# Patient Record
Sex: Female | Born: 1975 | Race: White | Hispanic: No | Marital: Married | Smoking: Never smoker
Health system: Southern US, Community
[De-identification: ages and names within clinical notes are randomized; demographics above are authoritative.]

## PROBLEM LIST (undated history)

## (undated) DIAGNOSIS — I10 Essential (primary) hypertension: Secondary | ICD-10-CM

## (undated) DIAGNOSIS — D497 Neoplasm of unspecified behavior of endocrine glands and other parts of nervous system: Secondary | ICD-10-CM

## (undated) DIAGNOSIS — Z9889 Other specified postprocedural states: Secondary | ICD-10-CM

## (undated) HISTORY — PX: OTHER SURGICAL HISTORY: SHX169

## (undated) HISTORY — DX: Essential (primary) hypertension: I10

## (undated) HISTORY — DX: Neoplasm of unspecified behavior of endocrine glands and other parts of nervous system: D49.7

## (undated) HISTORY — PX: TENDON REPAIR: SHX5111

---

## 1998-05-13 ENCOUNTER — Ambulatory Visit (HOSPITAL_BASED_OUTPATIENT_CLINIC_OR_DEPARTMENT_OTHER): Admission: RE | Admit: 1998-05-13 | Discharge: 1998-05-13 | Payer: Self-pay | Admitting: *Deleted

## 1998-09-25 ENCOUNTER — Other Ambulatory Visit: Admission: RE | Admit: 1998-09-25 | Discharge: 1998-09-25 | Payer: Self-pay | Admitting: Obstetrics and Gynecology

## 2000-01-18 ENCOUNTER — Other Ambulatory Visit: Admission: RE | Admit: 2000-01-18 | Discharge: 2000-01-18 | Payer: Self-pay | Admitting: Obstetrics and Gynecology

## 2001-01-17 ENCOUNTER — Other Ambulatory Visit: Admission: RE | Admit: 2001-01-17 | Discharge: 2001-01-17 | Payer: Self-pay | Admitting: Obstetrics and Gynecology

## 2002-03-28 ENCOUNTER — Other Ambulatory Visit: Admission: RE | Admit: 2002-03-28 | Discharge: 2002-03-28 | Payer: Self-pay | Admitting: Obstetrics and Gynecology

## 2003-04-02 ENCOUNTER — Other Ambulatory Visit: Admission: RE | Admit: 2003-04-02 | Discharge: 2003-04-02 | Payer: Self-pay | Admitting: Obstetrics and Gynecology

## 2004-04-09 ENCOUNTER — Other Ambulatory Visit: Admission: RE | Admit: 2004-04-09 | Discharge: 2004-04-09 | Payer: Self-pay | Admitting: Obstetrics and Gynecology

## 2005-06-16 ENCOUNTER — Other Ambulatory Visit: Admission: RE | Admit: 2005-06-16 | Discharge: 2005-06-16 | Payer: Self-pay | Admitting: Obstetrics and Gynecology

## 2006-08-08 ENCOUNTER — Other Ambulatory Visit: Admission: RE | Admit: 2006-08-08 | Discharge: 2006-08-08 | Payer: Self-pay | Admitting: Obstetrics & Gynecology

## 2007-10-12 ENCOUNTER — Other Ambulatory Visit: Admission: RE | Admit: 2007-10-12 | Discharge: 2007-10-12 | Payer: Self-pay | Admitting: Obstetrics and Gynecology

## 2008-11-28 ENCOUNTER — Other Ambulatory Visit: Admission: RE | Admit: 2008-11-28 | Discharge: 2008-11-28 | Payer: Self-pay | Admitting: Obstetrics and Gynecology

## 2018-08-16 ENCOUNTER — Encounter: Payer: Self-pay | Admitting: Adult Health

## 2018-08-22 ENCOUNTER — Encounter: Payer: Self-pay | Admitting: Adult Health

## 2018-09-07 ENCOUNTER — Encounter (INDEPENDENT_AMBULATORY_CARE_PROVIDER_SITE_OTHER): Payer: Self-pay

## 2018-09-07 ENCOUNTER — Ambulatory Visit (INDEPENDENT_AMBULATORY_CARE_PROVIDER_SITE_OTHER): Payer: PRIVATE HEALTH INSURANCE | Admitting: Adult Health

## 2018-09-07 ENCOUNTER — Encounter: Payer: Self-pay | Admitting: Adult Health

## 2018-09-07 VITALS — BP 137/81 | HR 82 | Ht 64.0 in | Wt 219.0 lb

## 2018-09-07 DIAGNOSIS — Z1212 Encounter for screening for malignant neoplasm of rectum: Secondary | ICD-10-CM

## 2018-09-07 DIAGNOSIS — Z1211 Encounter for screening for malignant neoplasm of colon: Secondary | ICD-10-CM | POA: Diagnosis not present

## 2018-09-07 DIAGNOSIS — Z975 Presence of (intrauterine) contraceptive device: Secondary | ICD-10-CM

## 2018-09-07 DIAGNOSIS — Z01419 Encounter for gynecological examination (general) (routine) without abnormal findings: Secondary | ICD-10-CM

## 2018-09-07 LAB — HEMOCCULT GUIAC POC 1CARD (OFFICE): FECAL OCCULT BLD: NEGATIVE

## 2018-09-07 NOTE — Progress Notes (Signed)
Patient ID: Regina Fisher, female   DOB: [birth year], 43 y.o.   MRN: [id number] History of Present Illness:  Regina Fisher is a 43 year old white female, married, G0P0 in for well woman gyn exam, she had normal pap last year she says.She is a new pt.She has nexplanon and it is due to be removed in November. She has a optic nerve sheath meningioma and needs to switch to a non hormonal birth control, she thinks,She has MRI and F/U  With neurologist in March, Dr Arlan Organ at [facility] and will ask him again about this. Her ophthalmologist  is Dr Hassell Done at [facility].  PCP is [place].  Current Medications, Allergies, Past Medical History, Past Surgical History, Family History and Social History were reviewed in [employer] record.     Review of Systems:  Patient denies any headaches, hearing loss, fatigue, blurred vision, shortness of breath, chest pain, abdominal pain, problems with bowel movements, urination, or intercourse. No joint pain or mood swings. She says she has not had period in like 20 years due to depo or nexplanon.   Physical Exam:BP 137/81 (BP Location: Left Arm, Patient Position: Sitting, Cuff Size: Large)   Pulse 82   Ht 5\' 4"  (1.626 m)   Wt 219 lb (99.3 kg)   BMI 37.59 kg/m  General:  Well developed, well nourished, no acute distress Skin:  Warm and dry Neck:  Midline trachea, normal thyroid, good ROM, no lymphadenopathy Lungs; Clear to auscultation bilaterally Breast:  No dominant palpable mass, retraction, or nipple discharge Cardiovascular: Regular rate and rhythm Abdomen:  Soft, non tender, no hepatosplenomegaly Pelvic:  External genitalia is normal in appearance, no lesions.  The vagina is normal in appearance. Urethra has no lesions or masses. The cervix is nulliparous.  Uterus is felt to be normal size, shape, and contour.  No adnexal masses or tenderness noted.Bladder is non tender, no masses felt. Rectal: Good sphincter tone, no polyps, or hemorrhoids felt.   Hemoccult negative. Extremities/musculoskeletal:  No swelling or varicosities noted, no clubbing or cyanosis. Nexplanon palpated in left arm.  Psych:  No mood changes, alert and cooperative,seems happy Fall risk is low. PHQ 2 score 0. Examination chaperoned by Diona Fanti CMA. Discussed could get paragard IUD, or have tubal and ablation or get husband to get vasectomy.   Impression:  1. Encounter for well woman exam with routine gynecological exam   2. Screening for colorectal cancer   3. Nexplanon in place      Plan:  Review handouts on paragard, tubal and ablation Physical in 1 year Pap in 2021 Mammogram yearly, has scheduled today at 2 pm Labs with PCP Colonoscopy between 45-50  Call if wants nexplanon removed

## 2018-09-21 ENCOUNTER — Other Ambulatory Visit: Payer: Self-pay

## 2018-09-21 ENCOUNTER — Emergency Department (HOSPITAL_COMMUNITY): Payer: PRIVATE HEALTH INSURANCE

## 2018-09-21 ENCOUNTER — Inpatient Hospital Stay (HOSPITAL_COMMUNITY)
Admission: EM | Admit: 2018-09-21 | Discharge: 2018-09-23 | DRG: 446 | Disposition: A | Discharge status: Home / Self Care | Payer: PRIVATE HEALTH INSURANCE | Attending: Family Medicine | Admitting: Family Medicine

## 2018-09-21 ENCOUNTER — Encounter (HOSPITAL_COMMUNITY): Payer: Self-pay | Admitting: Emergency Medicine

## 2018-09-21 DIAGNOSIS — K8020 Calculus of gallbladder without cholecystitis without obstruction: Secondary | ICD-10-CM | POA: Diagnosis present

## 2018-09-21 DIAGNOSIS — K8000 Calculus of gallbladder with acute cholecystitis without obstruction: Secondary | ICD-10-CM | POA: Diagnosis present

## 2018-09-21 DIAGNOSIS — K810 Acute cholecystitis: Secondary | ICD-10-CM | POA: Diagnosis present

## 2018-09-21 DIAGNOSIS — Z8249 Family history of ischemic heart disease and other diseases of the circulatory system: Secondary | ICD-10-CM | POA: Diagnosis not present

## 2018-09-21 DIAGNOSIS — Z8262 Family history of osteoporosis: Secondary | ICD-10-CM

## 2018-09-21 DIAGNOSIS — Z818 Family history of other mental and behavioral disorders: Secondary | ICD-10-CM | POA: Diagnosis not present

## 2018-09-21 DIAGNOSIS — Z79899 Other long term (current) drug therapy: Secondary | ICD-10-CM

## 2018-09-21 DIAGNOSIS — Z793 Long term (current) use of hormonal contraceptives: Secondary | ICD-10-CM | POA: Diagnosis not present

## 2018-09-21 DIAGNOSIS — K819 Cholecystitis, unspecified: Secondary | ICD-10-CM | POA: Diagnosis present

## 2018-09-21 DIAGNOSIS — R17 Unspecified jaundice: Secondary | ICD-10-CM

## 2018-09-21 LAB — COMPREHENSIVE METABOLIC PANEL
ALT: 474 U/L — ABNORMAL HIGH (ref 0–44)
AST: 574 U/L — ABNORMAL HIGH (ref 15–41)
Albumin: 4.1 g/dL (ref 3.5–5.0)
Alkaline Phosphatase: 141 U/L — ABNORMAL HIGH (ref 38–126)
Anion gap: 10 (ref 5–15)
BUN: 8 mg/dL (ref 6–20)
CO2: 19 mmol/L — ABNORMAL LOW (ref 22–32)
Calcium: 9.2 mg/dL (ref 8.9–10.3)
Chloride: 108 mmol/L (ref 98–111)
Creatinine, Ser: 0.75 mg/dL (ref 0.44–1.00)
GFR calc Af Amer: >60 mL/min (ref >60)
Glucose, Bld: 126 mg/dL — ABNORMAL HIGH (ref 70–99)
Potassium: 3.7 mmol/L (ref 3.5–5.1)
Sodium: 137 mmol/L (ref 135–145)
Total Bilirubin: 1.8 mg/dL — ABNORMAL HIGH (ref 0.3–1.2)
Total Protein: 8 g/dL (ref 6.5–8.1)

## 2018-09-21 LAB — URINALYSIS, ROUTINE W REFLEX MICROSCOPIC
Bilirubin Urine: NEGATIVE
Glucose, UA: NEGATIVE mg/dL
Hgb urine dipstick: NEGATIVE
Ketones, ur: NEGATIVE mg/dL
Leukocytes,Ua: NEGATIVE
Nitrite: NEGATIVE
Protein, ur: NEGATIVE mg/dL
SPECIFIC GRAVITY, URINE: 1.038 — AB (ref 1.005–1.030)
pH: 5 (ref 5.0–8.0)

## 2018-09-21 LAB — CBC
HCT: 41.2 % (ref 36.0–46.0)
Hemoglobin: 14 g/dL (ref 12.0–15.0)
MCH: 28.5 pg (ref 26.0–34.0)
MCHC: 34 g/dL (ref 30.0–36.0)
MCV: 83.9 fL (ref 80.0–100.0)
NRBC: 0 % (ref 0.0–0.2)
Platelets: 386 10*3/uL (ref 150–400)
RBC: 4.91 MIL/uL (ref 3.87–5.11)
RDW: 13.2 % (ref 11.5–15.5)
WBC: 7.4 10*3/uL (ref 4.0–10.5)

## 2018-09-21 LAB — I-STAT BETA HCG BLOOD, ED (MC, WL, AP ONLY): I-stat hCG, quantitative: <5 m[IU]/mL (ref <5)

## 2018-09-21 LAB — LIPASE, BLOOD: LIPASE: 33 U/L (ref 11–51)

## 2018-09-21 MED ORDER — ONDANSETRON HCL 4 MG/2ML IJ SOLN
4.0000 mg | Freq: Four times a day (QID) | INTRAMUSCULAR | Status: DC | PRN
  Administered 2018-09-21 – 2018-09-22 (×3): 4 mg via INTRAVENOUS
  Filled 2018-09-21 (×3): qty 2

## 2018-09-21 MED ORDER — ALBUTEROL SULFATE (2.5 MG/3ML) 0.083% IN NEBU: 2.5000 mg | INHALATION_SOLUTION | RESPIRATORY_TRACT | Status: DC | PRN

## 2018-09-21 MED ORDER — SODIUM CHLORIDE 0.9% FLUSH
3.0000 mL | Freq: Two times a day (BID) | INTRAVENOUS | Status: DC
  Administered 2018-09-22 (×2): 3 mL via INTRAVENOUS

## 2018-09-21 MED ORDER — HEPARIN SODIUM (PORCINE) 5000 UNIT/ML IJ SOLN
5000.0000 [IU] | Freq: Three times a day (TID) | INTRAMUSCULAR | Status: DC
  Administered 2018-09-21: 5000 [IU] via SUBCUTANEOUS
  Filled 2018-09-21: qty 1

## 2018-09-21 MED ORDER — IOHEXOL 300 MG/ML  SOLN
100.0000 mL | Freq: Once | INTRAMUSCULAR | Status: AC | PRN
  Administered 2018-09-21: 100 mL via INTRAVENOUS

## 2018-09-21 MED ORDER — IOPAMIDOL (ISOVUE-300) INJECTION 61%: 100.0000 mL | Freq: Once | INTRAVENOUS | Status: DC | PRN

## 2018-09-21 MED ORDER — SODIUM CHLORIDE 0.9 % IV SOLN
INTRAVENOUS | Status: DC
  Administered 2018-09-21: 11:00:00 via INTRAVENOUS

## 2018-09-21 MED ORDER — TRAZODONE HCL 50 MG PO TABS: 50.0000 mg | ORAL_TABLET | Freq: Every evening | ORAL | Status: DC | PRN

## 2018-09-21 MED ORDER — HYDROMORPHONE HCL 1 MG/ML IJ SOLN
1.0000 mg | INTRAMUSCULAR | Status: DC | PRN
  Administered 2018-09-21 – 2018-09-22 (×5): 1 mg via INTRAVENOUS
  Filled 2018-09-21 (×5): qty 1

## 2018-09-21 MED ORDER — SODIUM CHLORIDE 0.9 % IV SOLN: 1.0000 g | INTRAVENOUS | Status: DC

## 2018-09-21 MED ORDER — KETOROLAC TROMETHAMINE 15 MG/ML IJ SOLN: 15.0000 mg | Freq: Four times a day (QID) | INTRAMUSCULAR | Status: DC | PRN

## 2018-09-21 MED ORDER — KCL IN DEXTROSE-NACL 20-5-0.45 MEQ/L-%-% IV SOLN
INTRAVENOUS | Status: DC
  Administered 2018-09-21 – 2018-09-23 (×4): via INTRAVENOUS
  Filled 2018-09-21 (×4): qty 1000

## 2018-09-21 MED ORDER — SODIUM CHLORIDE 0.9 % IV SOLN: 250.0000 mL | INTRAVENOUS | Status: DC | PRN

## 2018-09-21 MED ORDER — ACETAMINOPHEN 650 MG RE SUPP: 650.0000 mg | Freq: Four times a day (QID) | RECTAL | Status: DC | PRN

## 2018-09-21 MED ORDER — FAMOTIDINE IN NACL 20-0.9 MG/50ML-% IV SOLN
20.0000 mg | Freq: Two times a day (BID) | INTRAVENOUS | Status: DC
  Administered 2018-09-21 – 2018-09-23 (×4): 20 mg via INTRAVENOUS
  Filled 2018-09-21 (×4): qty 50

## 2018-09-21 MED ORDER — SODIUM CHLORIDE 0.9 % IV SOLN: 1.0000 g | Freq: Once | INTRAVENOUS | Status: DC

## 2018-09-21 MED ORDER — POLYETHYLENE GLYCOL 3350 17 G PO PACK: 17.0000 g | PACK | Freq: Every day | ORAL | Status: DC | PRN

## 2018-09-21 MED ORDER — SODIUM CHLORIDE 0.9% FLUSH: 3.0000 mL | INTRAVENOUS | Status: DC | PRN

## 2018-09-21 MED ORDER — SODIUM CHLORIDE 0.9 % IV SOLN
2.0000 g | INTRAVENOUS | Status: DC
  Administered 2018-09-21 – 2018-09-22 (×2): 2 g via INTRAVENOUS
  Filled 2018-09-21 (×2): qty 20

## 2018-09-21 MED ORDER — HYDROMORPHONE HCL 1 MG/ML IJ SOLN
1.0000 mg | Freq: Once | INTRAMUSCULAR | Status: AC
  Administered 2018-09-21: 1 mg via INTRAVENOUS
  Filled 2018-09-21: qty 1

## 2018-09-21 MED ORDER — ONDANSETRON HCL 4 MG/2ML IJ SOLN
4.0000 mg | Freq: Once | INTRAMUSCULAR | Status: AC
  Administered 2018-09-21: 4 mg via INTRAVENOUS
  Filled 2018-09-21: qty 2

## 2018-09-21 MED ORDER — ONDANSETRON HCL 4 MG PO TABS: 4.0000 mg | ORAL_TABLET | Freq: Four times a day (QID) | ORAL | Status: DC | PRN

## 2018-09-21 MED ORDER — ACETAMINOPHEN 325 MG PO TABS
650.0000 mg | ORAL_TABLET | Freq: Four times a day (QID) | ORAL | Status: DC | PRN
  Administered 2018-09-21 – 2018-09-22 (×3): 650 mg via ORAL
  Filled 2018-09-21 (×3): qty 2

## 2018-09-21 MED ORDER — SODIUM CHLORIDE 0.9 % IV BOLUS
1000.0000 mL | Freq: Once | INTRAVENOUS | Status: AC
  Administered 2018-09-21: 1000 mL via INTRAVENOUS

## 2018-09-21 NOTE — ED Provider Notes (Signed)
[facility] EMERGENCY DEPARTMENT Provider Note   CSN: [id number] Arrival date & time: 09/21/18  0749    History   Chief Complaint Chief Complaint  Patient presents with  . Abdominal Pain    HPI Regina Fisher is a 43 y.o. female.     Patient with acute onset of epigastric abdominal pain at around 1 PM yesterday.  It is been constant patient has vomited multiple times.  Associated with a few loose bowel movements.  No true diarrhea or fevers.  Patient had a similar episode about a week ago that lasted just for a few hours and then resolved.  This has been persistent.  Pain is 10 out of 10.     Past Medical History:  Diagnosis Date  . Tumor of optic nerve     Patient Active Problem List   Diagnosis Date Noted  . Acute cholecystitis 09/21/2018  . Screening for colorectal cancer 09/07/2018  . Encounter for well woman exam with routine gynecological exam 09/07/2018  . Nexplanon in place 09/07/2018    Past Surgical History:  Procedure Laterality Date  . bone spur    . fatty turmor removal back    . gamma knife 2018    . ganna knight    . TENDON REPAIR       OB History    Gravida  0   Para  0   Term  0   Preterm  0   AB  0   Living  0     SAB  0   TAB  0   Ectopic  0   Multiple  0   Live Births  0            Home Medications    Prior to Admission medications   Medication Sig Start Date End Date Taking? Authorizing Provider  BLACK ELDERBERRY,BERRY-FLOWER, PO Take by mouth daily.    Yes [provider]  etonogestrel (NEXPLANON) 68 MG IMPL implant 1 each by Subdermal route once.   Yes [provider]    Family History Family History  Problem Relation Age of Onset  . Osteoporosis Maternal Grandmother   . Hypertension Mother   . Mental illness Brother     Social History Social History   Tobacco Use  . Smoking status: Never Smoker  . Smokeless tobacco: Never Used  Substance Use Topics  . Alcohol use: Never   Frequency: Never  . Drug use: Never     Allergies   Patient has no known allergies.   Review of Systems Review of Systems  Constitutional: Negative for chills and fever.  HENT: Negative for congestion, rhinorrhea and sore throat.   Eyes: Negative for visual disturbance.  Respiratory: Negative for cough and shortness of breath.   Cardiovascular: Negative for chest pain and leg swelling.  Gastrointestinal: Positive for abdominal pain, diarrhea, nausea and vomiting.  Genitourinary: Negative for dysuria.  Musculoskeletal: Negative for back pain and neck pain.  Skin: Negative for rash.  Neurological: Negative for dizziness, syncope, light-headedness and headaches.  Hematological: Does not bruise/bleed easily.  Psychiatric/Behavioral: Negative for confusion.     Physical Exam Updated Vital Signs BP (!) 151/90   Pulse 73   Temp 98.1 F (36.7 C) (Oral)   Resp 16   Ht 1.626 m (5\' 4" )   Wt 97.5 kg   SpO2 99%   BMI 36.90 kg/m   Physical Exam Vitals signs and nursing note reviewed.  Constitutional:      General:  She is not in acute distress.    Appearance: She is well-developed.  HENT:     Head: Normocephalic and atraumatic.     Mouth/Throat:     Mouth: Mucous membranes are moist.  Eyes:     Extraocular Movements: Extraocular movements intact.     Conjunctiva/sclera: Conjunctivae normal.     Pupils: Pupils are equal, round, and reactive to light.  Neck:     Musculoskeletal: Normal range of motion and neck supple.  Cardiovascular:     Rate and Rhythm: Normal rate and regular rhythm.     Heart sounds: No murmur.  Pulmonary:     Effort: Pulmonary effort is normal. No respiratory distress.     Breath sounds: Normal breath sounds.  Abdominal:     General: Bowel sounds are normal.     Palpations: Abdomen is soft.     Tenderness: There is abdominal tenderness.     Comments: Marked tenderness right upper quadrant with guarding  Musculoskeletal: Normal range of motion.    Skin:    General: Skin is warm and dry.     Capillary Refill: Capillary refill takes less than 2 seconds.  Neurological:     General: No focal deficit present.     Mental Status: She is alert and oriented to person, place, and time.      ED Treatments / Results  Labs (all labs ordered are listed, but only abnormal results are displayed) Labs Reviewed  COMPREHENSIVE METABOLIC PANEL - Abnormal; Notable for the following components:      Result Value   CO2 19 (*)    Glucose, Bld 126 (*)    AST 574 (*)    ALT 474 (*)    Alkaline Phosphatase 141 (*)    Total Bilirubin 1.8 (*)    All other components within normal limits  URINALYSIS, ROUTINE W REFLEX MICROSCOPIC - Abnormal; Notable for the following components:   Color, Urine AMBER (*)    Specific Gravity, Urine 1.038 (*)    All other components within normal limits  LIPASE, BLOOD  CBC  HIV ANTIBODY (ROUTINE TESTING W REFLEX)  I-STAT BETA HCG BLOOD, ED ([facility], [facility], [facility] ONLY)    EKG None  Radiology Ct Abdomen Pelvis W Contrast  Result Date: 09/21/2018 CLINICAL DATA:  Patient with upper abdominal pain radiating to the umbilicus. EXAM: CT ABDOMEN AND PELVIS WITH CONTRAST TECHNIQUE: Multidetector CT imaging of the abdomen and pelvis was performed using the standard protocol following bolus administration of intravenous contrast. CONTRAST:  168mL OMNIPAQUE IOHEXOL 300 MG/ML  SOLN COMPARISON:  None. FINDINGS: Lower chest: Normal heart size. Dependent atelectasis bilateral lower lobes. No pleural effusion. Hepatobiliary: Liver is normal in size and contour. Multiple gallstones. Wall thickening of the gallbladder. No intrahepatic or extrahepatic biliary ductal dilatation. Mild heterogeneity along the portal veins within the liver (image 18; series 2). Pancreas: Unremarkable Spleen: Unremarkable Adrenals/Urinary Tract: Normal adrenal glands. Kidneys enhance symmetrically with contrast. No hydronephrosis. Urinary bladder is unremarkable.  Stomach/Bowel: Sigmoid colonic diverticulosis. No CT evidence for acute diverticulitis. No abnormal bowel wall thickening or evidence for bowel obstruction. No free fluid or free intraperitoneal air. Small hiatal hernia. Normal morphology of the stomach. Vascular/Lymphatic: Normal caliber abdominal aorta. Peripheral calcified atherosclerotic plaque. No retroperitoneal lymphadenopathy. Reproductive: Uterus is unremarkable. Adnexal structures are unremarkable. Other: None. Musculoskeletal: Lumbar spine degenerative changes. No aggressive or acute appearing osseous lesions. IMPRESSION: 1. Mild wall thickening of the gallbladder. Multiple gallstones. Findings are indeterminate however raise the possibility of cholecystitis. Recommend  correlation with right upper quadrant ultrasound. 2. There is mild heterogeneous low attenuation along the course of the portal veins within the liver which may be secondary to intravenous hydration. Possibility of changes related to cholangitis not excluded. Recommend clinical and laboratory correlation. Electronically Signed   By: Lovey Newcomer M.D.   On: 09/21/2018 11:15   US Abdomen Limited  Result Date: 09/21/2018 CLINICAL DATA:  Known gallstones with upper abdominal pain and nausea for 2 days. EXAM: ULTRASOUND ABDOMEN LIMITED RIGHT UPPER QUADRANT COMPARISON:  None. FINDINGS: Gallbladder: Multiple mobile gallstones, largest measuring 1.3 cm. Trace pericholecystic fluid. Gallbladder wall thickness is within normal limits. No sonographic Murphy's sign elicited during the exam, per the sonographer. Common bile duct: Diameter: 5 mm Liver: No focal lesion identified. Within normal limits in parenchymal echogenicity. Portal vein is patent on color Doppler imaging with normal direction of blood flow towards the liver. IMPRESSION: 1. Multiple gallstones. Gallbladder wall thickness is within normal limits on this ultrasound, however, there is trace pericholecystic fluid and the appearance of  the gallbladder on today's earlier CT is suspicious for an early acute cholecystitis. If additional imaging confirmation is needed, consider nuclear medicine HIDA scan. 2. No bile duct dilatation. 3. Liver appears normal. Electronically Signed   By: Franki Cabot M.D.   On: 09/21/2018 14:10    Procedures Procedures (including critical care time)  Medications Ordered in ED Medications  iopamidol (ISOVUE-300) 61 % injection 100 mL (has no administration in time range)  sodium chloride flush (NS) 0.9 % injection 3 mL (has no administration in time range)  sodium chloride flush (NS) 0.9 % injection 3 mL (has no administration in time range)  0.9 %  sodium chloride infusion (has no administration in time range)  acetaminophen (TYLENOL) tablet 650 mg (has no administration in time range)    Or  acetaminophen (TYLENOL) suppository 650 mg (has no administration in time range)  traZODone (DESYREL) tablet 50 mg (has no administration in time range)  polyethylene glycol (MIRALAX / GLYCOLAX) packet 17 g (has no administration in time range)  ondansetron (ZOFRAN) tablet 4 mg ( Oral See Alternative 09/21/18 1513)    Or  ondansetron (ZOFRAN) injection 4 mg (4 mg Intravenous Given 09/21/18 1513)  albuterol (PROVENTIL) (2.5 MG/3ML) 0.083% nebulizer solution 2.5 mg (has no administration in time range)  heparin injection 5,000 Units (5,000 Units Subcutaneous Given 09/21/18 1359)  dextrose 5 % and 0.45 % NaCl with KCl 20 mEq/L infusion ( Intravenous New Bag/Given 09/21/18 1432)  ketorolac (TORADOL) 15 MG/ML injection 15 mg (has no administration in time range)  HYDROmorphone (DILAUDID) injection 1 mg (1 mg Intravenous Given 09/21/18 1514)  cefTRIAXone (ROCEPHIN) 2 g in sodium chloride 0.9 % 100 mL IVPB (2 g Intravenous New Bag/Given 09/21/18 1358)  sodium chloride 0.9 % bolus 1,000 mL (0 mLs Intravenous Stopped 09/21/18 1253)  ondansetron (ZOFRAN) injection 4 mg (4 mg Intravenous Given 09/21/18 0914)  HYDROmorphone  (DILAUDID) injection 1 mg (1 mg Intravenous Given 09/21/18 0906)  iohexol (OMNIPAQUE) 300 MG/ML solution 100 mL (100 mLs Intravenous Contrast Given 09/21/18 1042)     Initial Impression / Assessment and Plan / ED Course  I have reviewed the triage vital signs and the nursing notes.  Pertinent labs & imaging results that were available during my care of the patient were reviewed by me and considered in my medical decision making (see chart for details).       CT scan consistent with gallstone disease.  Questionable inflammation of  the gallbladder ducts were normal.  Patient despite pain medication still very tender right upper quadrant area.  Patient also had an episode similar to this about a week ago that did not last as long.  This started yesterday.  At around lunchtime.  Findings although no leukocytosis has mild elevation in bilirubin at 1.8.  LFTs slightly up.  Again no evidence of any biliary dilatation.  Discussed with Dr. Arnoldo Morale from general surgery who recommend getting her admitted for early acute cholecystitis.  Give 1 g of Rocephin which was ordered.  And go ahead and get formal ultrasound to better evaluate the common bile duct.  Ultrasound was done.  Common bile duct was normal.  Hospitalist will admit.  Based on these findings probably does not need ERCP at this time however if bilirubin increases may require that.     Final Clinical Impressions(s) / ED Diagnoses   Final diagnoses:  Cholecystitis    ED Discharge Orders    None       Fredia Sorrow, MD 09/21/18 [phone]

## 2018-09-21 NOTE — ED Notes (Addendum)
ED TO INPATIENT HANDOFF REPORT  ED Nurse Name and Phone #: Judson Roch [phone]  S Name/Age/Gender Regina Fisher 43 y.o. female Room/Bed: APA07/APA07  Code Status   Code Status: Full Code  Home/SNF/Other Home Patient oriented to: self, place, time and situation Is this baseline? Yes   Triage Complete: Triage complete  Chief Complaint ABD PAIN  Triage Note Pt c/o mid upper abdominal pain that began around 1300 yesterday. Pt states she originally thought it was heartburn because she ate green peppers. States pain is relieved after she belches, but comes back worse. Endorses nausea and dry heaving. No diarrhea or fevers. Pain worsens with palpation.   Allergies No Known Allergies  Level of Care/Admitting Diagnosis ED Disposition    ED Disposition Condition [facility]: [facility] [[id number]]  Level of Care: Med-Surg [16]  Diagnosis: Acute cholecystitis [575.0.ICD-9-CM]  Admitting Physician: Morrison Old  Attending Physician: Morrison Old  Estimated length of stay: 3 - 4 days  Certification:: I certify this patient will need inpatient services for at least 2 midnights  Bed request comments: Medsurg  PT Class (Do Not Modify): Inpatient [101]  PT Acc Code (Do Not Modify): Private [1]       B Medical/Surgery History Past Medical History:  Diagnosis Date  . Tumor of optic nerve    Past Surgical History:  Procedure Laterality Date  . bone spur    . fatty turmor removal back    . gamma knife 2018    . ganna knight    . TENDON REPAIR       A IV Location/Drains/Wounds Patient Lines/Drains/Airways Status   Active Line/Drains/Airways    Name:   Placement date:   Placement time:   Site:   Days:   Peripheral IV 09/21/18 Right Antecubital   09/21/18    0804    Antecubital   less than 1   Peripheral IV 09/21/18 Left Hand   09/21/18    1109    Hand   less than 1          Intake/Output Last 24 hours  Intake/Output  Summary (Last 24 hours) at 09/21/2018 1625 Last data filed at 09/21/2018 1428 Gross per 24 hour  Intake 1100 ml  Output -  Net 1100 ml    Labs/Imaging Results for orders placed or performed during the hospital encounter of 09/21/18 (from the past 48 hour(s))  Lipase, blood     Status: None   Collection Time: 09/21/18  8:11 AM  Result Value Ref Range   Lipase 33 11 - 51 U/L    Comment: Performed at [facility], [address], [place], [place] [address]  Comprehensive metabolic panel     Status: Abnormal   Collection Time: 09/21/18  8:11 AM  Result Value Ref Range   Sodium 137 135 - 145 mmol/L   Potassium 3.7 3.5 - 5.1 mmol/L   Chloride 108 98 - 111 mmol/L   CO2 19 (L) 22 - 32 mmol/L   Glucose, Bld 126 (H) 70 - 99 mg/dL   BUN 8 6 - 20 mg/dL   Creatinine, Ser 0.75 0.44 - 1.00 mg/dL   Calcium 9.2 8.9 - 10.3 mg/dL   Total Protein 8.0 6.5 - 8.1 g/dL   Albumin 4.1 3.5 - 5.0 g/dL   AST 574 (H) 15 - 41 U/L   ALT 474 (H) 0 - 44 U/L   Alkaline Phosphatase 141 (H) 38 - 126 U/L   Total  Bilirubin 1.8 (H) 0.3 - 1.2 mg/dL   GFR calc non Af Amer >60 >60 mL/min   GFR calc Af Amer >60 >60 mL/min   Anion gap 10 5 - 15    Comment: Performed at [facility], [address], [place], [place] [address]  CBC     Status: None   Collection Time: 09/21/18  8:11 AM  Result Value Ref Range   WBC 7.4 4.0 - 10.5 K/uL   RBC 4.91 3.87 - 5.11 MIL/uL   Hemoglobin 14.0 12.0 - 15.0 g/dL   HCT 41.2 36.0 - 46.0 %   MCV 83.9 80.0 - 100.0 fL   MCH 28.5 26.0 - 34.0 pg   MCHC 34.0 30.0 - 36.0 g/dL   RDW 13.2 11.5 - 15.5 %   Platelets 386 150 - 400 K/uL   nRBC 0.0 0.0 - 0.2 %    Comment: Performed at [facility], [address], [place], [place] [address]  I-Stat beta hCG blood, ED     Status: None   Collection Time: 09/21/18  9:17 AM  Result Value Ref Range   I-stat hCG, quantitative <5.0 <5 mIU/mL   Comment 3            Comment:   GEST. AGE      CONC.  (mIU/mL)   <=1 WEEK        5 - 50     2 WEEKS       50  - 500     3 WEEKS       100 - 10,000     4 WEEKS     1,000 - 30,000        FEMALE AND NON-PREGNANT FEMALE:     LESS THAN 5 mIU/mL   Urinalysis, Routine w reflex microscopic     Status: Abnormal   Collection Time: 09/21/18 11:10 AM  Result Value Ref Range   Color, Urine AMBER (A) YELLOW    Comment: BIOCHEMICALS MAY BE AFFECTED BY COLOR   APPearance CLEAR CLEAR   Specific Gravity, Urine 1.038 (H) 1.005 - 1.030   pH 5.0 5.0 - 8.0   Glucose, UA NEGATIVE NEGATIVE mg/dL   Hgb urine dipstick NEGATIVE NEGATIVE   Bilirubin Urine NEGATIVE NEGATIVE   Ketones, ur NEGATIVE NEGATIVE mg/dL   Protein, ur NEGATIVE NEGATIVE mg/dL   Nitrite NEGATIVE NEGATIVE   Leukocytes,Ua NEGATIVE NEGATIVE    Comment: Performed at [facility], [address] [place], [place] [address]   Ct Abdomen Pelvis W Contrast  Result Date: 09/21/2018 CLINICAL DATA:  Patient with upper abdominal pain radiating to the umbilicus. EXAM: CT ABDOMEN AND PELVIS WITH CONTRAST TECHNIQUE: Multidetector CT imaging of the abdomen and pelvis was performed using the standard protocol following bolus administration of intravenous contrast. CONTRAST:  148mL OMNIPAQUE IOHEXOL 300 MG/ML  SOLN COMPARISON:  None. FINDINGS: Lower chest: Normal heart size. Dependent atelectasis bilateral lower lobes. No pleural effusion. Hepatobiliary: Liver is normal in size and contour. Multiple gallstones. Wall thickening of the gallbladder. No intrahepatic or extrahepatic biliary ductal dilatation. Mild heterogeneity along the portal veins within the liver (image 18; series 2). Pancreas: Unremarkable Spleen: Unremarkable Adrenals/Urinary Tract: Normal adrenal glands. Kidneys enhance symmetrically with contrast. No hydronephrosis. Urinary bladder is unremarkable. Stomach/Bowel: Sigmoid colonic diverticulosis. No CT evidence for acute diverticulitis. No abnormal bowel wall thickening or evidence for bowel obstruction. No free fluid or free intraperitoneal air. Small hiatal  hernia. Normal morphology of the stomach. Vascular/Lymphatic: Normal caliber abdominal aorta. Peripheral  calcified atherosclerotic plaque. No retroperitoneal lymphadenopathy. Reproductive: Uterus is unremarkable. Adnexal structures are unremarkable. Other: None. Musculoskeletal: Lumbar spine degenerative changes. No aggressive or acute appearing osseous lesions. IMPRESSION: 1. Mild wall thickening of the gallbladder. Multiple gallstones. Findings are indeterminate however raise the possibility of cholecystitis. Recommend correlation with right upper quadrant ultrasound. 2. There is mild heterogeneous low attenuation along the course of the portal veins within the liver which may be secondary to intravenous hydration. Possibility of changes related to cholangitis not excluded. Recommend clinical and laboratory correlation. Electronically Signed   By: Lovey Newcomer M.D.   On: 09/21/2018 11:15   US Abdomen Limited  Result Date: 09/21/2018 CLINICAL DATA:  Known gallstones with upper abdominal pain and nausea for 2 days. EXAM: ULTRASOUND ABDOMEN LIMITED RIGHT UPPER QUADRANT COMPARISON:  None. FINDINGS: Gallbladder: Multiple mobile gallstones, largest measuring 1.3 cm. Trace pericholecystic fluid. Gallbladder wall thickness is within normal limits. No sonographic Murphy's sign elicited during the exam, per the sonographer. Common bile duct: Diameter: 5 mm Liver: No focal lesion identified. Within normal limits in parenchymal echogenicity. Portal vein is patent on color Doppler imaging with normal direction of blood flow towards the liver. IMPRESSION: 1. Multiple gallstones. Gallbladder wall thickness is within normal limits on this ultrasound, however, there is trace pericholecystic fluid and the appearance of the gallbladder on today's earlier CT is suspicious for an early acute cholecystitis. If additional imaging confirmation is needed, consider nuclear medicine HIDA scan. 2. No bile duct dilatation. 3. Liver appears  normal. Electronically Signed   By: Franki Cabot M.D.   On: 09/21/2018 14:10    Pending Labs Unresulted Labs (From admission, onward)    Start     Ordered   09/22/18 0500  CBC  Tomorrow morning,   R     09/21/18 1337   09/22/18 0500  Comprehensive metabolic panel  Tomorrow morning,   R     09/21/18 1337   09/21/18 1338  HIV antibody (Routine Testing)  Once,   R     09/21/18 1337          Vitals/Pain Today's Vitals   09/21/18 1509 09/21/18 1530 09/21/18 1552 09/21/18 1600  BP:  (!) 151/90  133/72  Pulse:  73  75  Resp:  16  14  Temp:      TempSrc:      SpO2:  99%  97%  Weight:      Height:      PainSc: 6   2      Isolation Precautions No active isolations  Medications Medications  iopamidol (ISOVUE-300) 61 % injection 100 mL (has no administration in time range)  sodium chloride flush (NS) 0.9 % injection 3 mL (has no administration in time range)  sodium chloride flush (NS) 0.9 % injection 3 mL (has no administration in time range)  0.9 %  sodium chloride infusion (has no administration in time range)  acetaminophen (TYLENOL) tablet 650 mg (has no administration in time range)    Or  acetaminophen (TYLENOL) suppository 650 mg (has no administration in time range)  traZODone (DESYREL) tablet 50 mg (has no administration in time range)  polyethylene glycol (MIRALAX / GLYCOLAX) packet 17 g (has no administration in time range)  ondansetron (ZOFRAN) tablet 4 mg ( Oral See Alternative 09/21/18 1513)    Or  ondansetron (ZOFRAN) injection 4 mg (4 mg Intravenous Given 09/21/18 1513)  albuterol (PROVENTIL) (2.5 MG/3ML) 0.083% nebulizer solution 2.5 mg (has no administration in time range)  heparin injection 5,000 Units (5,000 Units Subcutaneous Given 09/21/18 1359)  dextrose 5 % and 0.45 % NaCl with KCl 20 mEq/L infusion ( Intravenous New Bag/Given 09/21/18 1432)  ketorolac (TORADOL) 15 MG/ML injection 15 mg (has no administration in time range)  HYDROmorphone (DILAUDID) injection  1 mg (1 mg Intravenous Given 09/21/18 1514)  cefTRIAXone (ROCEPHIN) 2 g in sodium chloride 0.9 % 100 mL IVPB (0 g Intravenous Stopped 09/21/18 1428)  sodium chloride 0.9 % bolus 1,000 mL (0 mLs Intravenous Stopped 09/21/18 1253)  ondansetron (ZOFRAN) injection 4 mg (4 mg Intravenous Given 09/21/18 0914)  HYDROmorphone (DILAUDID) injection 1 mg (1 mg Intravenous Given 09/21/18 0906)  iohexol (OMNIPAQUE) 300 MG/ML solution 100 mL (100 mLs Intravenous Contrast Given 09/21/18 1042)    Mobility walks Low fall risk   Focused Assessments    R Recommendations: See Admitting Provider Note  Report given to: Quillian Quince RN  Additional Notes:

## 2018-09-21 NOTE — ED Triage Notes (Signed)
Pt c/o mid upper abdominal pain that began around 1300 yesterday. Pt states she originally thought it was heartburn because she ate green peppers. States pain is relieved after she belches, but comes back worse. Endorses nausea and dry heaving. No diarrhea or fevers. Pain worsens with palpation.

## 2018-09-21 NOTE — H&P (Signed)
Patient Demographics:    Regina Fisher, is a 43 y.o. female  MRN: [id number]   DOB - 1976/07/07  Admit Date - 09/21/2018  Outpatient Primary MD for the patient is [facility]   Assessment & Plan:    Principal Problem:   Acute cholecystitis Active Problems:   Cholelithiasis   1)Cholelithiasis with suspected acute cholecystitis----given abdominal pain, persistent emesis, elevated LFTs including bilirubin suspect that patient may have passed a stone CT abdomen and  abdominal ultrasound report noted (CT scan consistent with gallstone disease.  Questionable inflammation of the gallbladder ducts were normal, there is Pericholecystic fluid)--- continue IV Rocephin, surgical evaluation from Dr. Arnoldo Morale pending, patient may not need ERCP, for possible lap chole per Dr. Arnoldo Morale... PRN antiemetics PRN opiates and Toradol  2)FEN--- IV fluids with dextrose while n.p.o.    With History of - Reviewed by me  Past Medical History:  Diagnosis Date  . Tumor of optic nerve       Past Surgical History:  Procedure Laterality Date  . bone spur    . fatty turmor removal back    . gamma knife 2018    . ganna knight    . TENDON REPAIR        Chief Complaint  Patient presents with  . Abdominal Pain      HPI:    Regina Fisher  is a 43 y.o. female presents to the ED with epigastric and right upper quadrant pain associated with nausea, vomiting and dry heaves..  Patient had similar episodes of GI symptoms over a week ago which spontaneously resolved at that time  No fevers, no chills, no rigors, no URI symptoms no chest pains or palpitations or dizziness  In ED-- CT scan consistent with gallstone disease.  Questionable inflammation of the gallbladder ducts were normal, there is pericholecystic  fluid  In ED... Lipase is 33, WBC 7.4, ..... Total bili is 1.8, AST is 574, ALT 474, alk phos is 141  Additional history obtained from patient's husband and sister-in-law at bedside   Review of systems:    In addition to the HPI above,   A full Review of  Systems was done, all other systems reviewed are negative except as noted above in HPI , .    Social History:  Reviewed by me    Social History   Tobacco Use  . Smoking status: Never Smoker  . Smokeless tobacco: Never Used  Substance Use Topics  . Alcohol use: Never    Frequency: Never       Family History :  Reviewed by me    Family History  Problem Relation Age of Onset  . Osteoporosis Maternal Grandmother   . Hypertension Mother   . Mental illness Brother     Home Medications:   Prior to Admission medications   Medication Sig Start Date End Date Taking? Authorizing Provider  BLACK ELDERBERRY,BERRY-FLOWER, PO Take by mouth daily.  Yes [provider]  etonogestrel (NEXPLANON) 68 MG IMPL implant 1 each by Subdermal route once.   Yes [provider]     Allergies:    No Known Allergies   Physical Exam:   Vitals  Blood pressure 133/72, pulse 75, temperature 98.1 F (36.7 C), temperature source Oral, resp. rate 14, height 5' 4"  (1.626 m), weight 97.5 kg, SpO2 97 %.  Physical Examination: General appearance - alert, well appearing, and in no distress Mental status - alert, oriented to person, place, and time,  Eyes - sclera anicteric Neck - supple, no JVD elevation , Chest - clear  to auscultation bilaterally, symmetrical air movement,  Heart - S1 and S2 normal, regular  Abdomen - soft, tender epigastric and right upper quadrant areas, nondistended, no masses or organomegaly, no CVA area tenderness Neurological - screening mental status exam normal, neck supple without rigidity, cranial nerves II through XII intact, DTR's normal and symmetric Extremities - no pedal edema noted,  intact peripheral pulses  Skin - warm, dry     Data Review:    CBC Recent Labs  Lab 09/21/18 0811  WBC 7.4  HGB 14.0  HCT 41.2  PLT 386  MCV 83.9  MCH 28.5  MCHC 34.0  RDW 13.2   ------------------------------------------------------------------------------------------------------------------  Chemistries  Recent Labs  Lab 09/21/18 0811  NA 137  K 3.7  CL 108  CO2 19*  GLUCOSE 126*  BUN 8  CREATININE 0.75  CALCIUM 9.2  AST 574*  ALT 474*  ALKPHOS 141*  BILITOT 1.8*   ------------------------------------------------------------------------------------------------------------------ estimated creatinine clearance is 104.9 mL/min (by C-G formula based on SCr of 0.75 mg/dL). ------------------------------------------------------------------------------------------------------------------ No results for input(s): TSH, T4TOTAL, T3FREE, THYROIDAB in the last 72 hours.  Invalid input(s): FREET3   Coagulation profile No results for input(s): INR, PROTIME in the last 168 hours. ------------------------------------------------------------------------------------------------------------------- No results for input(s): DDIMER in the last 72 hours. -------------------------------------------------------------------------------------------------------------------  Cardiac Enzymes No results for input(s): CKMB, TROPONINI, MYOGLOBIN in the last 168 hours.  Invalid input(s): CK ------------------------------------------------------------------------------------------------------------------ No results found for: BNP   ---------------------------------------------------------------------------------------------------------------  Urinalysis    Component Value Date/Time   COLORURINE AMBER (A) 09/21/2018 1110   APPEARANCEUR CLEAR 09/21/2018 1110   LABSPEC 1.038 (H) 09/21/2018 1110   PHURINE 5.0 09/21/2018 1110   GLUCOSEU NEGATIVE 09/21/2018 1110   HGBUR NEGATIVE  09/21/2018 [place] 09/21/2018 1110   KETONESUR NEGATIVE 09/21/2018 1110   PROTEINUR NEGATIVE 09/21/2018 1110   NITRITE NEGATIVE 09/21/2018 1110   LEUKOCYTESUR NEGATIVE 09/21/2018 1110    ----------------------------------------------------------------------------------------------------------------   Imaging Results:    Ct Abdomen Pelvis W Contrast  Result Date: 09/21/2018 CLINICAL DATA:  Patient with upper abdominal pain radiating to the umbilicus. EXAM: CT ABDOMEN AND PELVIS WITH CONTRAST TECHNIQUE: Multidetector CT imaging of the abdomen and pelvis was performed using the standard protocol following bolus administration of intravenous contrast. CONTRAST:  168m OMNIPAQUE IOHEXOL 300 MG/ML  SOLN COMPARISON:  None. FINDINGS: Lower chest: Normal heart size. Dependent atelectasis bilateral lower lobes. No pleural effusion. Hepatobiliary: Liver is normal in size and contour. Multiple gallstones. Wall thickening of the gallbladder. No intrahepatic or extrahepatic biliary ductal dilatation. Mild heterogeneity along the portal veins within the liver (image 18; series 2). Pancreas: Unremarkable Spleen: Unremarkable Adrenals/Urinary Tract: Normal adrenal glands. Kidneys enhance symmetrically with contrast. No hydronephrosis. Urinary bladder is unremarkable. Stomach/Bowel: Sigmoid colonic diverticulosis. No CT evidence for acute diverticulitis. No abnormal bowel wall thickening or evidence for bowel obstruction. No free fluid or free  intraperitoneal air. Small hiatal hernia. Normal morphology of the stomach. Vascular/Lymphatic: Normal caliber abdominal aorta. Peripheral calcified atherosclerotic plaque. No retroperitoneal lymphadenopathy. Reproductive: Uterus is unremarkable. Adnexal structures are unremarkable. Other: None. Musculoskeletal: Lumbar spine degenerative changes. No aggressive or acute appearing osseous lesions. IMPRESSION: 1. Mild wall thickening of the gallbladder. Multiple  gallstones. Findings are indeterminate however raise the possibility of cholecystitis. Recommend correlation with right upper quadrant ultrasound. 2. There is mild heterogeneous low attenuation along the course of the portal veins within the liver which may be secondary to intravenous hydration. Possibility of changes related to cholangitis not excluded. Recommend clinical and laboratory correlation. Electronically Signed   By: Lovey Newcomer M.D.   On: 09/21/2018 11:15   US Abdomen Limited  Result Date: 09/21/2018 CLINICAL DATA:  Known gallstones with upper abdominal pain and nausea for 2 days. EXAM: ULTRASOUND ABDOMEN LIMITED RIGHT UPPER QUADRANT COMPARISON:  None. FINDINGS: Gallbladder: Multiple mobile gallstones, largest measuring 1.3 cm. Trace pericholecystic fluid. Gallbladder wall thickness is within normal limits. No sonographic Murphy's sign elicited during the exam, per the sonographer. Common bile duct: Diameter: 5 mm Liver: No focal lesion identified. Within normal limits in parenchymal echogenicity. Portal vein is patent on color Doppler imaging with normal direction of blood flow towards the liver. IMPRESSION: 1. Multiple gallstones. Gallbladder wall thickness is within normal limits on this ultrasound, however, there is trace pericholecystic fluid and the appearance of the gallbladder on today's earlier CT is suspicious for an early acute cholecystitis. If additional imaging confirmation is needed, consider nuclear medicine HIDA scan. 2. No bile duct dilatation. 3. Liver appears normal. Electronically Signed   By: Franki Cabot M.D.   On: 09/21/2018 14:10    Radiological Exams on Admission: Ct Abdomen Pelvis W Contrast  Result Date: 09/21/2018 CLINICAL DATA:  Patient with upper abdominal pain radiating to the umbilicus. EXAM: CT ABDOMEN AND PELVIS WITH CONTRAST TECHNIQUE: Multidetector CT imaging of the abdomen and pelvis was performed using the standard protocol following bolus administration of  intravenous contrast. CONTRAST:  131m OMNIPAQUE IOHEXOL 300 MG/ML  SOLN COMPARISON:  None. FINDINGS: Lower chest: Normal heart size. Dependent atelectasis bilateral lower lobes. No pleural effusion. Hepatobiliary: Liver is normal in size and contour. Multiple gallstones. Wall thickening of the gallbladder. No intrahepatic or extrahepatic biliary ductal dilatation. Mild heterogeneity along the portal veins within the liver (image 18; series 2). Pancreas: Unremarkable Spleen: Unremarkable Adrenals/Urinary Tract: Normal adrenal glands. Kidneys enhance symmetrically with contrast. No hydronephrosis. Urinary bladder is unremarkable. Stomach/Bowel: Sigmoid colonic diverticulosis. No CT evidence for acute diverticulitis. No abnormal bowel wall thickening or evidence for bowel obstruction. No free fluid or free intraperitoneal air. Small hiatal hernia. Normal morphology of the stomach. Vascular/Lymphatic: Normal caliber abdominal aorta. Peripheral calcified atherosclerotic plaque. No retroperitoneal lymphadenopathy. Reproductive: Uterus is unremarkable. Adnexal structures are unremarkable. Other: None. Musculoskeletal: Lumbar spine degenerative changes. No aggressive or acute appearing osseous lesions. IMPRESSION: 1. Mild wall thickening of the gallbladder. Multiple gallstones. Findings are indeterminate however raise the possibility of cholecystitis. Recommend correlation with right upper quadrant ultrasound. 2. There is mild heterogeneous low attenuation along the course of the portal veins within the liver which may be secondary to intravenous hydration. Possibility of changes related to cholangitis not excluded. Recommend clinical and laboratory correlation. Electronically Signed   By: DLovey NewcomerM.D.   On: 09/21/2018 11:15   UK[place]Abdomen Limited  Result Date: 09/21/2018 CLINICAL DATA:  Known gallstones with upper abdominal pain and nausea for 2 days. EXAM: ULTRASOUND ABDOMEN  LIMITED RIGHT UPPER QUADRANT COMPARISON:   None. FINDINGS: Gallbladder: Multiple mobile gallstones, largest measuring 1.3 cm. Trace pericholecystic fluid. Gallbladder wall thickness is within normal limits. No sonographic Murphy's sign elicited during the exam, per the sonographer. Common bile duct: Diameter: 5 mm Liver: No focal lesion identified. Within normal limits in parenchymal echogenicity. Portal vein is patent on color Doppler imaging with normal direction of blood flow towards the liver. IMPRESSION: 1. Multiple gallstones. Gallbladder wall thickness is within normal limits on this ultrasound, however, there is trace pericholecystic fluid and the appearance of the gallbladder on today's earlier CT is suspicious for an early acute cholecystitis. If additional imaging confirmation is needed, consider nuclear medicine HIDA scan. 2. No bile duct dilatation. 3. Liver appears normal. Electronically Signed   By: Franki Cabot M.D.   On: 09/21/2018 14:10    DVT Prophylaxis -SCD /heparin AM Labs Ordered, also please review Full Orders  Family Communication: Admission, patients condition and plan of care including tests being ordered have been discussed with the patient  who indicate understanding and agree with the plan   Code Status - Full Code  Likely DC to  Home   Condition   stable  Roxan Hockey M.D on 09/21/2018 at 5:11 PM Go to [email or web address] -  for contact info  [facility] - Office  [phone]

## 2018-09-22 ENCOUNTER — Inpatient Hospital Stay (HOSPITAL_COMMUNITY): Payer: PRIVATE HEALTH INSURANCE

## 2018-09-22 DIAGNOSIS — K8000 Calculus of gallbladder with acute cholecystitis without obstruction: Principal | ICD-10-CM

## 2018-09-22 LAB — COMPREHENSIVE METABOLIC PANEL
ALT: 339 U/L — ABNORMAL HIGH (ref 0–44)
AST: 246 U/L — ABNORMAL HIGH (ref 15–41)
Albumin: 3.2 g/dL — ABNORMAL LOW (ref 3.5–5.0)
Alkaline Phosphatase: 142 U/L — ABNORMAL HIGH (ref 38–126)
Anion gap: 5 (ref 5–15)
BUN: 5 mg/dL — ABNORMAL LOW (ref 6–20)
CHLORIDE: 107 mmol/L (ref 98–111)
CO2: 25 mmol/L (ref 22–32)
Calcium: 8.7 mg/dL — ABNORMAL LOW (ref 8.9–10.3)
Creatinine, Ser: 0.64 mg/dL (ref 0.44–1.00)
GFR calc non Af Amer: >60 mL/min (ref >60)
Glucose, Bld: 127 mg/dL — ABNORMAL HIGH (ref 70–99)
Potassium: 4.4 mmol/L (ref 3.5–5.1)
Sodium: 137 mmol/L (ref 135–145)
Total Bilirubin: 2.4 mg/dL — ABNORMAL HIGH (ref 0.3–1.2)
Total Protein: 6.7 g/dL (ref 6.5–8.1)

## 2018-09-22 LAB — CBC
HEMATOCRIT: 35.4 % — AB (ref 36.0–46.0)
Hemoglobin: 11.8 g/dL — ABNORMAL LOW (ref 12.0–15.0)
MCH: 28.2 pg (ref 26.0–34.0)
MCHC: 33.3 g/dL (ref 30.0–36.0)
MCV: 84.7 fL (ref 80.0–100.0)
Platelets: 298 10*3/uL (ref 150–400)
RBC: 4.18 MIL/uL (ref 3.87–5.11)
RDW: 13.6 % (ref 11.5–15.5)
WBC: 6.3 10*3/uL (ref 4.0–10.5)
nRBC: 0 % (ref 0.0–0.2)

## 2018-09-22 LAB — HIV ANTIBODY (ROUTINE TESTING W REFLEX): HIV Screen 4th Generation wRfx: NONREACTIVE

## 2018-09-22 MED ORDER — GADOBUTROL 1 MMOL/ML IV SOLN
10.0000 mL | Freq: Once | INTRAVENOUS | Status: AC | PRN
  Administered 2018-09-22: 10 mL via INTRAVENOUS

## 2018-09-22 MED ORDER — HEPARIN SODIUM (PORCINE) 5000 UNIT/ML IJ SOLN
5000.0000 [IU] | Freq: Three times a day (TID) | INTRAMUSCULAR | Status: DC
  Filled 2018-09-22: qty 1

## 2018-09-22 MED ORDER — OXYCODONE-ACETAMINOPHEN 5-325 MG PO TABS: 1.0000 | ORAL_TABLET | ORAL | Status: DC | PRN

## 2018-09-22 MED ORDER — HYDROMORPHONE HCL 1 MG/ML IJ SOLN: 1.0000 mg | INTRAMUSCULAR | Status: DC | PRN

## 2018-09-22 MED ORDER — LORAZEPAM 2 MG/ML IJ SOLN: 1.0000 mg | INTRAMUSCULAR | Status: DC | PRN

## 2018-09-22 NOTE — Progress Notes (Signed)
Patient complaining of nausea with second episode of emesis. MD has given verbal order to administer additional dose of PRN medication Zofran injection 4 mg. Medication administered and patient on her way for  [place] MRCP.

## 2018-09-22 NOTE — Consult Note (Signed)
Reason for Consult: Cholecystitis, cholelithiasis Referring Physician: Dr. Marina Goodell Regina Fisher is an 43 y.o. female.  HPI: Patient is a 43 year old white female who presented to the emergency room with worsening right upper quadrant abdominal pain, nausea, and vomiting.  She had a previous episode 1 week ago but it resolved spontaneously.  No fever, chills, jaundice have been noted.  In the emergency room, she was noted to have transaminitis but a normal white blood cell count.  CT scan of the abdomen revealed cholecystitis with cholelithiasis.  She was admitted to the hospital for further evaluation and treatment.  This morning, her total bilirubin had increased with her transaminases still elevated.  An MRCP was ordered and this was negative for choledocholithiasis.  Patient states she is hungry and still has right upper quadrant abdominal pain.  She rates her pain as 8 out of 10.  Past Medical History:  Diagnosis Date  . Tumor of optic nerve     Past Surgical History:  Procedure Laterality Date  . bone spur    . fatty turmor removal back    . gamma knife 2018    . ganna knight    . TENDON REPAIR      Family History  Problem Relation Age of Onset  . Osteoporosis Maternal Grandmother   . Hypertension Mother   . Mental illness Brother     Social History:  reports that she has never smoked. She has never used smokeless tobacco. She reports that she does not drink alcohol or use drugs.  Allergies: No Known Allergies  Medications: I have reviewed the patient's current medications.  Results for orders placed or performed during the hospital encounter of 09/21/18 (from the past 48 hour(s))  Lipase, blood     Status: None   Collection Time: 09/21/18  8:11 AM  Result Value Ref Range   Lipase 33 11 - 51 U/L    Comment: Performed at [facility], [address], [place], [place] [address]  Comprehensive metabolic panel     Status: Abnormal   Collection Time: 09/21/18  8:11 AM   Result Value Ref Range   Sodium 137 135 - 145 mmol/L   Potassium 3.7 3.5 - 5.1 mmol/L   Chloride 108 98 - 111 mmol/L   CO2 19 (L) 22 - 32 mmol/L   Glucose, Bld 126 (H) 70 - 99 mg/dL   BUN 8 6 - 20 mg/dL   Creatinine, Ser 0.75 0.44 - 1.00 mg/dL   Calcium 9.2 8.9 - 10.3 mg/dL   Total Protein 8.0 6.5 - 8.1 g/dL   Albumin 4.1 3.5 - 5.0 g/dL   AST 574 (H) 15 - 41 U/L   ALT 474 (H) 0 - 44 U/L   Alkaline Phosphatase 141 (H) 38 - 126 U/L   Total Bilirubin 1.8 (H) 0.3 - 1.2 mg/dL   GFR calc non Af Amer >60 >60 mL/min   GFR calc Af Amer >60 >60 mL/min   Anion gap 10 5 - 15    Comment: Performed at [facility], [address] [place], [place] [address]  CBC     Status: None   Collection Time: 09/21/18  8:11 AM  Result Value Ref Range   WBC 7.4 4.0 - 10.5 K/uL   RBC 4.91 3.87 - 5.11 MIL/uL   Hemoglobin 14.0 12.0 - 15.0 g/dL   HCT 41.2 36.0 - 46.0 %   MCV 83.9 80.0 - 100.0 fL   MCH 28.5 26.0 - 34.0 pg  MCHC 34.0 30.0 - 36.0 g/dL   RDW 13.2 11.5 - 15.5 %   Platelets 386 150 - 400 K/uL   nRBC 0.0 0.0 - 0.2 %    Comment: Performed at [facility], [address], [place], [place] [address]  HIV antibody (Routine Testing)     Status: None   Collection Time: 09/21/18  8:11 AM  Result Value Ref Range   HIV Screen 4th Generation wRfx Non Reactive Non Reactive    Comment: (NOTE) Performed At: [facility] [address] [place], [place] [id number] Rush Farmer MD [id number]   I-Stat beta hCG blood, ED     Status: None   Collection Time: 09/21/18  9:17 AM  Result Value Ref Range   I-stat hCG, quantitative <5.0 <5 mIU/mL   Comment 3            Comment:   GEST. AGE      CONC.  (mIU/mL)   <=1 WEEK        5 - 50     2 WEEKS       50 - 500     3 WEEKS       100 - 10,000     4 WEEKS     1,000 - 30,000        FEMALE AND NON-PREGNANT FEMALE:     LESS THAN 5 mIU/mL   Urinalysis, Routine w reflex microscopic     Status: Abnormal   Collection Time: 09/21/18 11:10 AM  Result  Value Ref Range   Color, Urine AMBER (A) YELLOW    Comment: BIOCHEMICALS MAY BE AFFECTED BY COLOR   APPearance CLEAR CLEAR   Specific Gravity, Urine 1.038 (H) 1.005 - 1.030   pH 5.0 5.0 - 8.0   Glucose, UA NEGATIVE NEGATIVE mg/dL   Hgb urine dipstick NEGATIVE NEGATIVE   Bilirubin Urine NEGATIVE NEGATIVE   Ketones, ur NEGATIVE NEGATIVE mg/dL   Protein, ur NEGATIVE NEGATIVE mg/dL   Nitrite NEGATIVE NEGATIVE   Leukocytes,Ua NEGATIVE NEGATIVE    Comment: Performed at [facility], [address], [place], [place] [address]  CBC     Status: Abnormal   Collection Time: 09/22/18  4:40 AM  Result Value Ref Range   WBC 6.3 4.0 - 10.5 K/uL   RBC 4.18 3.87 - 5.11 MIL/uL   Hemoglobin 11.8 (L) 12.0 - 15.0 g/dL   HCT 35.4 (L) 36.0 - 46.0 %   MCV 84.7 80.0 - 100.0 fL   MCH 28.2 26.0 - 34.0 pg   MCHC 33.3 30.0 - 36.0 g/dL   RDW 13.6 11.5 - 15.5 %   Platelets 298 150 - 400 K/uL   nRBC 0.0 0.0 - 0.2 %    Comment: Performed at [facility], [address] [place], [place] [address]  Comprehensive metabolic panel     Status: Abnormal   Collection Time: 09/22/18  4:40 AM  Result Value Ref Range   Sodium 137 135 - 145 mmol/L   Potassium 4.4 3.5 - 5.1 mmol/L   Chloride 107 98 - 111 mmol/L   CO2 25 22 - 32 mmol/L   Glucose, Bld 127 (H) 70 - 99 mg/dL   BUN 5 (L) 6 - 20 mg/dL   Creatinine, Ser 0.64 0.44 - 1.00 mg/dL   Calcium 8.7 (L) 8.9 - 10.3 mg/dL   Total Protein 6.7 6.5 - 8.1 g/dL   Albumin 3.2 (L) 3.5 - 5.0 g/dL   AST 246 (H) 15 - 41 U/L  ALT 339 (H) 0 - 44 U/L   Alkaline Phosphatase 142 (H) 38 - 126 U/L   Total Bilirubin 2.4 (H) 0.3 - 1.2 mg/dL   GFR calc non Af Amer >60 >60 mL/min   GFR calc Af Amer >60 >60 mL/min   Anion gap 5 5 - 15    Comment: Performed at [facility], [address] [place], [place] [address]    Ct Abdomen Pelvis W Contrast  Result Date: 09/21/2018 CLINICAL DATA:  Patient with upper abdominal pain radiating to the umbilicus. EXAM: CT ABDOMEN AND PELVIS WITH  CONTRAST TECHNIQUE: Multidetector CT imaging of the abdomen and pelvis was performed using the standard protocol following bolus administration of intravenous contrast. CONTRAST:  17mL OMNIPAQUE IOHEXOL 300 MG/ML  SOLN COMPARISON:  None. FINDINGS: Lower chest: Normal heart size. Dependent atelectasis bilateral lower lobes. No pleural effusion. Hepatobiliary: Liver is normal in size and contour. Multiple gallstones. Wall thickening of the gallbladder. No intrahepatic or extrahepatic biliary ductal dilatation. Mild heterogeneity along the portal veins within the liver (image 18; series 2). Pancreas: Unremarkable Spleen: Unremarkable Adrenals/Urinary Tract: Normal adrenal glands. Kidneys enhance symmetrically with contrast. No hydronephrosis. Urinary bladder is unremarkable. Stomach/Bowel: Sigmoid colonic diverticulosis. No CT evidence for acute diverticulitis. No abnormal bowel wall thickening or evidence for bowel obstruction. No free fluid or free intraperitoneal air. Small hiatal hernia. Normal morphology of the stomach. Vascular/Lymphatic: Normal caliber abdominal aorta. Peripheral calcified atherosclerotic plaque. No retroperitoneal lymphadenopathy. Reproductive: Uterus is unremarkable. Adnexal structures are unremarkable. Other: None. Musculoskeletal: Lumbar spine degenerative changes. No aggressive or acute appearing osseous lesions. IMPRESSION: 1. Mild wall thickening of the gallbladder. Multiple gallstones. Findings are indeterminate however raise the possibility of cholecystitis. Recommend correlation with right upper quadrant ultrasound. 2. There is mild heterogeneous low attenuation along the course of the portal veins within the liver which may be secondary to intravenous hydration. Possibility of changes related to cholangitis not excluded. Recommend clinical and laboratory correlation. Electronically Signed   By: Lovey Newcomer M.D.   On: 09/21/2018 11:15   Mr 3d Recon At Scanner  Result Date:  09/22/2018 CLINICAL DATA:  Cholelithiasis. Nausea and vomiting. Jaundice. Abdominal pain. Trace pericholecystic fluid on the prior ultrasound. EXAM: MRI ABDOMEN WITHOUT AND WITH CONTRAST (INCLUDING MRCP) TECHNIQUE: Multiplanar multisequence MR imaging of the abdomen was performed both before and after the administration of intravenous contrast. Heavily T2-weighted images of the biliary and pancreatic ducts were obtained, and three-dimensional MRCP images were rendered by post processing. CONTRAST:  10 cc Gadavist COMPARISON:  Multiple exams, including CT and ultrasound exams from 09/21/2018 FINDINGS: Despite efforts by the technologist and patient, motion artifact is present on today's exam and could not be eliminated. This reduces exam sensitivity and specificity. Lower chest: Unremarkable Hepatobiliary: Multiple gallstones in the gallbladder measuring up to 1.5 cm in diameter. Gallbladder wall thickening at 4 mm in diameter. Mildly accentuated enhancement along the gallbladder fossa which can be secondary to local inflammation in raises the possibility of cholecystitis. No significant filling defect is observed in the common bile duct or common hepatic duct and the common bile duct measures about 4 mm in diameter. She no compelling findings of cholangitis. No significant abnormal focal hepatic enhancement. Pancreas:  Unremarkable Spleen:  Unremarkable Adrenals/Urinary Tract:  Unremarkable Stomach/Bowel: Unremarkable Vascular/Lymphatic:  Unremarkable Other:  No supplemental non-categorized findings. Musculoskeletal: Unremarkable IMPRESSION: 1. Gallbladder wall thickening with multiple gallstones, and with some accentuated enhancement in the hepatic parenchyma along the gallbladder fossa raising the possibility of cholecystitis. No  compelling findings of cholangitis and no choledocholithiasis identified. 2. No current compelling findings of periportal edema. Electronically Signed   By: Van Clines M.D.   On:  09/22/2018 10:15   US Abdomen Limited  Result Date: 09/21/2018 CLINICAL DATA:  Known gallstones with upper abdominal pain and nausea for 2 days. EXAM: ULTRASOUND ABDOMEN LIMITED RIGHT UPPER QUADRANT COMPARISON:  None. FINDINGS: Gallbladder: Multiple mobile gallstones, largest measuring 1.3 cm. Trace pericholecystic fluid. Gallbladder wall thickness is within normal limits. No sonographic Murphy's sign elicited during the exam, per the sonographer. Common bile duct: Diameter: 5 mm Liver: No focal lesion identified. Within normal limits in parenchymal echogenicity. Portal vein is patent on color Doppler imaging with normal direction of blood flow towards the liver. IMPRESSION: 1. Multiple gallstones. Gallbladder wall thickness is within normal limits on this ultrasound, however, there is trace pericholecystic fluid and the appearance of the gallbladder on today's earlier CT is suspicious for an early acute cholecystitis. If additional imaging confirmation is needed, consider nuclear medicine HIDA scan. 2. No bile duct dilatation. 3. Liver appears normal. Electronically Signed   By: Franki Cabot M.D.   On: 09/21/2018 14:10   Mr Abdomen Mrcp Moise Boring Contast  Result Date: 09/22/2018 CLINICAL DATA:  Cholelithiasis. Nausea and vomiting. Jaundice. Abdominal pain. Trace pericholecystic fluid on the prior ultrasound. EXAM: MRI ABDOMEN WITHOUT AND WITH CONTRAST (INCLUDING MRCP) TECHNIQUE: Multiplanar multisequence MR imaging of the abdomen was performed both before and after the administration of intravenous contrast. Heavily T2-weighted images of the biliary and pancreatic ducts were obtained, and three-dimensional MRCP images were rendered by post processing. CONTRAST:  10 cc Gadavist COMPARISON:  Multiple exams, including CT and ultrasound exams from 09/21/2018 FINDINGS: Despite efforts by the technologist and patient, motion artifact is present on today's exam and could not be eliminated. This reduces exam sensitivity  and specificity. Lower chest: Unremarkable Hepatobiliary: Multiple gallstones in the gallbladder measuring up to 1.5 cm in diameter. Gallbladder wall thickening at 4 mm in diameter. Mildly accentuated enhancement along the gallbladder fossa which can be secondary to local inflammation in raises the possibility of cholecystitis. No significant filling defect is observed in the common bile duct or common hepatic duct and the common bile duct measures about 4 mm in diameter. She no compelling findings of cholangitis. No significant abnormal focal hepatic enhancement. Pancreas:  Unremarkable Spleen:  Unremarkable Adrenals/Urinary Tract:  Unremarkable Stomach/Bowel: Unremarkable Vascular/Lymphatic:  Unremarkable Other:  No supplemental non-categorized findings. Musculoskeletal: Unremarkable IMPRESSION: 1. Gallbladder wall thickening with multiple gallstones, and with some accentuated enhancement in the hepatic parenchyma along the gallbladder fossa raising the possibility of cholecystitis. No compelling findings of cholangitis and no choledocholithiasis identified. 2. No current compelling findings of periportal edema. Electronically Signed   By: Van Clines M.D.   On: 09/22/2018 10:15    ROS:  Pertinent items are noted in HPI.  Blood pressure (!) 143/87, pulse 89, temperature (!) 97.5 F (36.4 C), temperature source Oral, resp. rate 16, height 5\' 4"  (1.626 m), weight 97.5 kg, SpO2 97 %. Physical Exam: Anxious obese white female Head is normocephalic, atraumatic Lungs clear to auscultation with good breath sounds bilaterally Heart examination reveals regular rate and rhythm without S3, S4, murmurs Abdomen is soft with tenderness in the right upper quadrant to palpation.  No rigidity is noted.  Difficult to assess hepatosplenomegaly due to body habitus.  Labs and x-ray results reviewed  Assessment/Plan: Impression: Cholecystitis with cholelithiasis.  No evidence of choledocholithiasis Plan: I told  the  patient that she will require laparoscopic cholecystectomy once her liver enzyme tests have improved.  Patient expressed frustration with the whole process.  We will give low-fat diet.  Will increase her IV Dilaudid.  Will reassess her labs in the morning.  Regina Fisher 09/22/2018, 10:39 AM

## 2018-09-22 NOTE — Progress Notes (Signed)
Patient Demographics:    Regina Fisher, is a 43 y.o. female, DOB - Jul 20, 1975, BTD:[id number]  Admit date - 09/21/2018   Admitting Physician Tasheika Kitzmiller Denton Brick, MD  Outpatient Primary MD for the patient is [facility]  LOS - 1   Chief Complaint  Patient presents with  . Abdominal Pain        Subjective:    Regina Fisher today has no fevers, No chest pain, patient's husband, mother-in-law and sister-in-law as well as niece at bedside, she has had abdominal pain with episodes of emesis,  Assessment  & Plan :    Principal Problem:   Acute cholecystitis Active Problems:   Cholelithiasis  Brief summary 43 year old otherwise healthy female admitted on 09/21/2018 with abdominal pain and vomiting and found to have acute calculus cholecystitis  Plan 1) acute calculus cholecystitis ----AST and ALT trending down, bilirubin up to 2.4 from 1.8, MRCP without choledocholithiasis or cholangitis, but with evidence of cholecystitis, suspect that patient may have passed a stone.  On admission CT abdomen and  abdominal ultrasound report noted (CT scan consistent with gallstone disease, there was Pericholecystic fluid)--- continue IV Rocephin, surgical evaluation from Dr. Arnoldo Morale appreciated,  does not appear to require ERCP, timing of lap chole as per Dr. Arnoldo Morale... PRN antiemetics PRN opiates and Toradol--- low-fat diet, continue IV fluids until oral intake is more reliable   Disposition/Need for in-Hospital Stay- patient unable to be discharged at this time due to bilirubin trending up, emesis and abdominal pain persist  Code Status : Full  Family Communication:   patient's husband, mother-in-law and sister-in-law as well as niece at bedside,  Disposition Plan  : TBD  Consults  :  Gen surg  DVT Prophylaxis  : - Heparin    Lab Results  Component Value Date   PLT 298 09/22/2018     Inpatient Medications  Scheduled Meds: . sodium chloride flush  3 mL Intravenous Q12H   Continuous Infusions: . sodium chloride    . cefTRIAXone (ROCEPHIN)  IV 2 g (09/22/18 1620)  . dextrose 5 % and 0.45 % NaCl with KCl 20 mEq/L 100 mL/hr at 09/22/18 0803  . famotidine (PEPCID) IV 20 mg (09/22/18 1227)   PRN Meds:.sodium chloride, acetaminophen **OR** acetaminophen, albuterol, HYDROmorphone (DILAUDID) injection, iopamidol, ketorolac, LORazepam, ondansetron **OR** ondansetron (ZOFRAN) IV, oxyCODONE-acetaminophen, polyethylene glycol, sodium chloride flush, traZODone    Anti-infectives (From admission, onward)   Start     Dose/Rate Route Frequency Ordered Stop   09/22/18 0600  cefTRIAXone (ROCEPHIN) 1 g in sodium chloride 0.9 % 100 mL IVPB  Status:  Discontinued     1 g 200 mL/hr over 30 Minutes Intravenous Every 24 hours 09/21/18 1337 09/21/18 1342   09/21/18 1400  cefTRIAXone (ROCEPHIN) 2 g in sodium chloride 0.9 % 100 mL IVPB     2 g 200 mL/hr over 30 Minutes Intravenous Every 24 hours 09/21/18 1342     09/21/18 1345  cefTRIAXone (ROCEPHIN) 1 g in sodium chloride 0.9 % 100 mL IVPB  Status:  Discontinued     1 g 200 mL/hr over 30 Minutes Intravenous  Once 09/21/18 1334 09/21/18 1343        Objective:   Vitals:   09/21/18 1730 09/21/18 2152 09/22/18  0536 09/22/18 1315  BP: (!) 145/73 (!) 152/87 (!) 143/87 (!) 142/87  Pulse: 79 85 89 75  Resp: 16 20 16 18   Temp:  98.1 F (36.7 C) (!) 97.5 F (36.4 C) 98.5 F (36.9 C)  TempSrc:  Oral Oral Oral  SpO2: 98% 98% 97%   Weight:      Height:        Wt Readings from Last 3 Encounters:  09/21/18 97.5 kg  09/07/18 99.3 kg     Intake/Output Summary (Last 24 hours) at 09/22/2018 1758 Last data filed at 09/22/2018 1534 Gross per 24 hour  Intake 1949.55 ml  Output -  Net 1949.55 ml     Physical Exam Patient is examined daily including today on 09/22/18 , exams remain the same as of yesterday except that has changed    Gen:- Awake Alert,   HEENT:- [place].AT, +vesclera icterus Neck-Supple Neck,No JVD,.  Lungs-  CTAB , fair symmetrical air movement CV- S1, S2 normal, regular  Abd-  +ve B.Sounds, Abd Soft, improving epigastric and right upper quadrant area tenderness Extremity/Skin:- No  edema, pedal pulses present Psych-affect is appropriate, oriented x3 Neuro-no new focal deficits, no tremors   Data Review:   Micro Results No results found for this or any previous visit (from the past 240 hour(s)).  Radiology Reports Ct Abdomen Pelvis W Contrast  Result Date: 09/21/2018 CLINICAL DATA:  Patient with upper abdominal pain radiating to the umbilicus. EXAM: CT ABDOMEN AND PELVIS WITH CONTRAST TECHNIQUE: Multidetector CT imaging of the abdomen and pelvis was performed using the standard protocol following bolus administration of intravenous contrast. CONTRAST:  166mL OMNIPAQUE IOHEXOL 300 MG/ML  SOLN COMPARISON:  None. FINDINGS: Lower chest: Normal heart size. Dependent atelectasis bilateral lower lobes. No pleural effusion. Hepatobiliary: Liver is normal in size and contour. Multiple gallstones. Wall thickening of the gallbladder. No intrahepatic or extrahepatic biliary ductal dilatation. Mild heterogeneity along the portal veins within the liver (image 18; series 2). Pancreas: Unremarkable Spleen: Unremarkable Adrenals/Urinary Tract: Normal adrenal glands. Kidneys enhance symmetrically with contrast. No hydronephrosis. Urinary bladder is unremarkable. Stomach/Bowel: Sigmoid colonic diverticulosis. No CT evidence for acute diverticulitis. No abnormal bowel wall thickening or evidence for bowel obstruction. No free fluid or free intraperitoneal air. Small hiatal hernia. Normal morphology of the stomach. Vascular/Lymphatic: Normal caliber abdominal aorta. Peripheral calcified atherosclerotic plaque. No retroperitoneal lymphadenopathy. Reproductive: Uterus is unremarkable. Adnexal structures are unremarkable. Other: None.  Musculoskeletal: Lumbar spine degenerative changes. No aggressive or acute appearing osseous lesions. IMPRESSION: 1. Mild wall thickening of the gallbladder. Multiple gallstones. Findings are indeterminate however raise the possibility of cholecystitis. Recommend correlation with right upper quadrant ultrasound. 2. There is mild heterogeneous low attenuation along the course of the portal veins within the liver which may be secondary to intravenous hydration. Possibility of changes related to cholangitis not excluded. Recommend clinical and laboratory correlation. Electronically Signed   By: Lovey Newcomer M.D.   On: 09/21/2018 11:15   Mr 3d Recon At Scanner  Result Date: 09/22/2018 CLINICAL DATA:  Cholelithiasis. Nausea and vomiting. Jaundice. Abdominal pain. Trace pericholecystic fluid on the prior ultrasound. EXAM: MRI ABDOMEN WITHOUT AND WITH CONTRAST (INCLUDING MRCP) TECHNIQUE: Multiplanar multisequence MR imaging of the abdomen was performed both before and after the administration of intravenous contrast. Heavily T2-weighted images of the biliary and pancreatic ducts were obtained, and three-dimensional MRCP images were rendered by post processing. CONTRAST:  10 cc Gadavist COMPARISON:  Multiple exams, including CT and ultrasound exams from 09/21/2018 FINDINGS: Despite efforts  by the technologist and patient, motion artifact is present on today's exam and could not be eliminated. This reduces exam sensitivity and specificity. Lower chest: Unremarkable Hepatobiliary: Multiple gallstones in the gallbladder measuring up to 1.5 cm in diameter. Gallbladder wall thickening at 4 mm in diameter. Mildly accentuated enhancement along the gallbladder fossa which can be secondary to local inflammation in raises the possibility of cholecystitis. No significant filling defect is observed in the common bile duct or common hepatic duct and the common bile duct measures about 4 mm in diameter. She no compelling findings of  cholangitis. No significant abnormal focal hepatic enhancement. Pancreas:  Unremarkable Spleen:  Unremarkable Adrenals/Urinary Tract:  Unremarkable Stomach/Bowel: Unremarkable Vascular/Lymphatic:  Unremarkable Other:  No supplemental non-categorized findings. Musculoskeletal: Unremarkable IMPRESSION: 1. Gallbladder wall thickening with multiple gallstones, and with some accentuated enhancement in the hepatic parenchyma along the gallbladder fossa raising the possibility of cholecystitis. No compelling findings of cholangitis and no choledocholithiasis identified. 2. No current compelling findings of periportal edema. Electronically Signed   By: Van Clines M.D.   On: 09/22/2018 10:15   US Abdomen Limited  Result Date: 09/21/2018 CLINICAL DATA:  Known gallstones with upper abdominal pain and nausea for 2 days. EXAM: ULTRASOUND ABDOMEN LIMITED RIGHT UPPER QUADRANT COMPARISON:  None. FINDINGS: Gallbladder: Multiple mobile gallstones, largest measuring 1.3 cm. Trace pericholecystic fluid. Gallbladder wall thickness is within normal limits. No sonographic Murphy's sign elicited during the exam, per the sonographer. Common bile duct: Diameter: 5 mm Liver: No focal lesion identified. Within normal limits in parenchymal echogenicity. Portal vein is patent on color Doppler imaging with normal direction of blood flow towards the liver. IMPRESSION: 1. Multiple gallstones. Gallbladder wall thickness is within normal limits on this ultrasound, however, there is trace pericholecystic fluid and the appearance of the gallbladder on today's earlier CT is suspicious for an early acute cholecystitis. If additional imaging confirmation is needed, consider nuclear medicine HIDA scan. 2. No bile duct dilatation. 3. Liver appears normal. Electronically Signed   By: Franki Cabot M.D.   On: 09/21/2018 14:10   Mr Abdomen Mrcp Moise Boring Contast  Result Date: 09/22/2018 CLINICAL DATA:  Cholelithiasis. Nausea and vomiting. Jaundice.  Abdominal pain. Trace pericholecystic fluid on the prior ultrasound. EXAM: MRI ABDOMEN WITHOUT AND WITH CONTRAST (INCLUDING MRCP) TECHNIQUE: Multiplanar multisequence MR imaging of the abdomen was performed both before and after the administration of intravenous contrast. Heavily T2-weighted images of the biliary and pancreatic ducts were obtained, and three-dimensional MRCP images were rendered by post processing. CONTRAST:  10 cc Gadavist COMPARISON:  Multiple exams, including CT and ultrasound exams from 09/21/2018 FINDINGS: Despite efforts by the technologist and patient, motion artifact is present on today's exam and could not be eliminated. This reduces exam sensitivity and specificity. Lower chest: Unremarkable Hepatobiliary: Multiple gallstones in the gallbladder measuring up to 1.5 cm in diameter. Gallbladder wall thickening at 4 mm in diameter. Mildly accentuated enhancement along the gallbladder fossa which can be secondary to local inflammation in raises the possibility of cholecystitis. No significant filling defect is observed in the common bile duct or common hepatic duct and the common bile duct measures about 4 mm in diameter. She no compelling findings of cholangitis. No significant abnormal focal hepatic enhancement. Pancreas:  Unremarkable Spleen:  Unremarkable Adrenals/Urinary Tract:  Unremarkable Stomach/Bowel: Unremarkable Vascular/Lymphatic:  Unremarkable Other:  No supplemental non-categorized findings. Musculoskeletal: Unremarkable IMPRESSION: 1. Gallbladder wall thickening with multiple gallstones, and with some accentuated enhancement in the hepatic parenchyma along the gallbladder fossa  raising the possibility of cholecystitis. No compelling findings of cholangitis and no choledocholithiasis identified. 2. No current compelling findings of periportal edema. Electronically Signed   By: Van Clines M.D.   On: 09/22/2018 10:15     CBC Recent Labs  Lab 09/21/18 0811  09/22/18 0440  WBC 7.4 6.3  HGB 14.0 11.8*  HCT 41.2 35.4*  PLT 386 298  MCV 83.9 84.7  MCH 28.5 28.2  MCHC 34.0 33.3  RDW 13.2 13.6    Chemistries  Recent Labs  Lab 09/21/18 0811 09/22/18 0440  NA 137 137  K 3.7 4.4  CL 108 107  CO2 19* 25  GLUCOSE 126* 127*  BUN 8 5*  CREATININE 0.75 0.64  CALCIUM 9.2 8.7*  AST 574* 246*  ALT 474* 339*  ALKPHOS 141* 142*  BILITOT 1.8* 2.4*   ------------------------------------------------------------------------------------------------------------------ No results for input(s): CHOL, HDL, LDLCALC, TRIG, CHOLHDL, LDLDIRECT in the last 72 hours.  No results found for: HGBA1C ------------------------------------------------------------------------------------------------------------------ No results for input(s): TSH, T4TOTAL, T3FREE, THYROIDAB in the last 72 hours.  Invalid input(s): FREET3 ------------------------------------------------------------------------------------------------------------------ No results for input(s): VITAMINB12, FOLATE, FERRITIN, TIBC, IRON, RETICCTPCT in the last 72 hours.  Coagulation profile No results for input(s): INR, PROTIME in the last 168 hours.  No results for input(s): DDIMER in the last 72 hours.  Cardiac Enzymes No results for input(s): CKMB, TROPONINI, MYOGLOBIN in the last 168 hours.  Invalid input(s): CK ------------------------------------------------------------------------------------------------------------------ No results found for: BNP   Roxan Hockey M.D on 09/22/2018 at 5:58 PM  Go to [email or web address] - for contact info  [facility] - Office  [phone]

## 2018-09-23 LAB — CBC
HEMATOCRIT: 37.4 % (ref 36.0–46.0)
Hemoglobin: 12.5 g/dL (ref 12.0–15.0)
MCH: 28.7 pg (ref 26.0–34.0)
MCHC: 33.4 g/dL (ref 30.0–36.0)
MCV: 86 fL (ref 80.0–100.0)
Platelets: 343 10*3/uL (ref 150–400)
RBC: 4.35 MIL/uL (ref 3.87–5.11)
RDW: 13.4 % (ref 11.5–15.5)
WBC: 5.7 10*3/uL (ref 4.0–10.5)
nRBC: 0 % (ref 0.0–0.2)

## 2018-09-23 LAB — COMPREHENSIVE METABOLIC PANEL
ALT: 237 U/L — ABNORMAL HIGH (ref 0–44)
AST: 99 U/L — ABNORMAL HIGH (ref 15–41)
Albumin: 3.5 g/dL (ref 3.5–5.0)
Alkaline Phosphatase: 139 U/L — ABNORMAL HIGH (ref 38–126)
Anion gap: 8 (ref 5–15)
BUN: 6 mg/dL (ref 6–20)
CHLORIDE: 106 mmol/L (ref 98–111)
CO2: 24 mmol/L (ref 22–32)
Calcium: 9 mg/dL (ref 8.9–10.3)
Creatinine, Ser: 0.76 mg/dL (ref 0.44–1.00)
GFR calc Af Amer: >60 mL/min (ref >60)
GFR calc non Af Amer: >60 mL/min (ref >60)
Glucose, Bld: 103 mg/dL — ABNORMAL HIGH (ref 70–99)
Potassium: 3.9 mmol/L (ref 3.5–5.1)
Sodium: 138 mmol/L (ref 135–145)
Total Bilirubin: 0.6 mg/dL (ref 0.3–1.2)
Total Protein: 7 g/dL (ref 6.5–8.1)

## 2018-09-23 MED ORDER — PANTOPRAZOLE SODIUM 40 MG PO TBEC: 40.0000 mg | DELAYED_RELEASE_TABLET | Freq: Every day | ORAL | 1 refills | Status: DC

## 2018-09-23 MED ORDER — ACETAMINOPHEN 325 MG PO TABS: 650.0000 mg | ORAL_TABLET | Freq: Four times a day (QID) | ORAL | 0 refills | Status: DC | PRN

## 2018-09-23 MED ORDER — HYDROCODONE-ACETAMINOPHEN 5-325 MG PO TABS: 1.0000 | ORAL_TABLET | ORAL | 0 refills | Status: DC | PRN

## 2018-09-23 MED ORDER — ONDANSETRON HCL 4 MG PO TABS: 4.0000 mg | ORAL_TABLET | Freq: Four times a day (QID) | ORAL | 0 refills | Status: DC | PRN

## 2018-09-23 NOTE — Discharge Instructions (Signed)
1)Avoid ibuprofen/Advil/Aleve/Motrin/Goody Powders/Naproxen/BC powders/Meloxicam/Diclofenac/Indomethacin and other Nonsteroidal anti-inflammatory medications as these will make you more likely to bleed and can cause stomach ulcers, can also cause Kidney problems.   2) low-fat diet advised  3) follow-up with Dr. Arnoldo Morale on Wednesday, 09/27/2018 for gallbladder surgery  4) no food or drink after midnight on Tuesday, 09/26/2018  Laparoscopic Cholecystectomy Laparoscopic cholecystectomy is surgery to remove the gallbladder. The gallbladder is a pear-shaped organ that lies beneath the liver on the right side of the body. The gallbladder stores bile, which is a fluid that helps the body to digest fats. Cholecystectomy is often done for inflammation of the gallbladder (cholecystitis). This condition is usually caused by a buildup of gallstones (cholelithiasis) in the gallbladder. Gallstones can block the flow of bile, which can result in inflammation and pain. In severe cases, emergency surgery may be required. This procedure is done though small incisions in your abdomen (laparoscopic surgery). A thin scope with a camera (laparoscope) is inserted through one incision. Thin surgical instruments are inserted through the other incisions. In some cases, a laparoscopic procedure may be turned into a type of surgery that is done through a larger incision (open surgery). Tell a health care provider about:  Any allergies you have.  All medicines you are taking, including vitamins, herbs, eye drops, creams, and over-the-counter medicines.  Any problems you or family members have had with anesthetic medicines.  Any blood disorders you have.  Any surgeries you have had.  Any medical conditions you have.  Whether you are pregnant or may be pregnant. What are the risks? Generally, this is a safe procedure. However, problems may occur, including:  Infection.  Bleeding.  Allergic reactions to  medicines.  Damage to other structures or organs.  A stone remaining in the common bile duct. The common bile duct carries bile from the gallbladder into the small intestine.  A bile leak from the cyst duct that is clipped when your gallbladder is removed. What happens before the procedure? Staying hydrated Follow instructions from your health care provider about hydration, which may include:  Up to 2 hours before the procedure - you may continue to drink clear liquids, such as water, clear fruit juice, black coffee, and plain tea. Medicines  Ask your health care provider about: ? Changing or stopping your regular medicines. This is especially important if you are taking diabetes medicines or blood thinners. ? Taking medicines such as aspirin and ibuprofen. These medicines can thin your blood. Do not take these medicines before your procedure if your health care provider instructs you not to.  You may be given antibiotic medicine to help prevent infection. General instructions  Let your health care provider know if you develop a cold or an infection before surgery.  Plan to have someone take you home from the hospital or clinic.  Ask your health care provider how your surgical site will be marked or identified. What happens during the procedure?   To reduce your risk of infection: ? Your health care team will wash or sanitize their hands. ? Your skin will be washed with soap. ? Hair may be removed from the surgical area.  An IV tube may be inserted into one of your veins.  You will be given one or more of the following: ? A medicine to help you relax (sedative). ? A medicine to make you fall asleep (general anesthetic).  A breathing tube will be placed in your mouth.  Your surgeon will make several small  cuts (incisions) in your abdomen.  The laparoscope will be inserted through one of the small incisions. The camera on the laparoscope will send images to a TV screen  (monitor) in the operating room. This lets your surgeon see inside your abdomen.  Air-like gas will be pumped into your abdomen. This will expand your abdomen to give the surgeon more room to perform the surgery.  Other tools that are needed for the procedure will be inserted through the other incisions. The gallbladder will be removed through one of the incisions.  Your common bile duct may be examined. If stones are found in the common bile duct, they may be removed.  After your gallbladder has been removed, the incisions will be closed with stitches (sutures), staples, or skin glue.  Your incisions may be covered with a bandage (dressing). The procedure may vary among health care providers and hospitals. What happens after the procedure?  Your blood pressure, heart rate, breathing rate, and blood oxygen level will be monitored until the medicines you were given have worn off.  You will be given medicines as needed to control your pain.  Do not drive for 24 hours if you were given a sedative. This information is not intended to replace advice given to you by your health care provider. Make sure you discuss any questions you have with your health care provider. Document Released: 07/05/2005 Document Revised: 06/02/2017 Document Reviewed: 12/22/2015 Elsevier Interactive Patient Education  2019 [place] ibuprofen/Advil/Aleve/Motrin/Goody Powders/Naproxen/BC powders/Meloxicam/Diclofenac/Indomethacin and other Nonsteroidal anti-inflammatory medications as these will make you more likely to bleed and can cause stomach ulcers, can also cause Kidney problems.   2) low-fat diet advised  3) follow-up with Dr. Arnoldo Morale on Wednesday, 09/27/2018 for gallbladder surgery  4) no food or drink after midnight on Tuesday, 09/26/2018

## 2018-09-23 NOTE — Discharge Summary (Signed)
Regina Fisher, is a 43 y.o. female  DOB 01/12/1976  MRN [id number].  Admission date:  09/21/2018  Admitting Physician  Roxan Hockey, MD  Discharge Date:  09/23/2018   Primary MD  Pllc, [facility]  Recommendations for primary care physician for things to follow:  1)Avoid ibuprofen/Advil/Aleve/Motrin/Goody Powders/Naproxen/BC powders/Meloxicam/Diclofenac/Indomethacin and other Nonsteroidal anti-inflammatory medications as these will make you more likely to bleed and can cause stomach ulcers, can also cause Kidney problems.   2) low-fat diet advised  3) follow-up with Dr. Arnoldo Morale on Wednesday, 09/27/2018 for gallbladder surgery  4) no food or drink after midnight on Tuesday, 09/26/2018   Admission Diagnosis  Cholecystitis [K81.9]   Discharge Diagnosis  Cholecystitis [K81.9]    Principal Problem:   Acute cholecystitis Active Problems:   Cholelithiasis      Past Medical History:  Diagnosis Date  . Tumor of optic nerve     Past Surgical History:  Procedure Laterality Date  . bone spur    . fatty turmor removal back    . gamma knife 2018    . ganna knight    . TENDON REPAIR         HPI  from the history and physical done on the day of admission:     Regina Fisher  is a 43 y.o. female presents to the ED with epigastric and right upper quadrant pain associated with nausea, vomiting and dry heaves..  Patient had similar episodes of GI symptoms over a week ago which spontaneously resolved at that time  No fevers, no chills, no rigors, no URI symptoms no chest pains or palpitations or dizziness  In ED-- CT scan consistent with gallstone disease. Questionable inflammation of the gallbladder ducts were normal, there is pericholecystic fluid  In ED... Lipase is 33, WBC 7.4, ..... Total bili is 1.8, AST is 574, ALT 474, alk phos is 141  Additional history obtained from  patient's husband and sister-in-law at bedside   Hospital Course:      Brief summary 43 year old otherwise healthy female admitted on 09/21/2018 with abdominal pain and vomiting and found to have acute calculus cholecystitis  Plan 1) acute calculus cholecystitis ----AST and ALT trending down, bilirubin down to 0.6 from 2.4 ,  MRCP without choledocholithiasis or cholangitis, but with evidence of cholecystitis, suspect that patient may have passed a stone.  On admissionCT abdomen and abdominal ultrasound report noted (CT scan consistent with gallstone disease, there was Pericholecystic fluid)--- treated with IV Rocephin, surgical evaluation from Dr. Arnoldo Morale appreciated,  does not appear to require ERCP, follow-up with Dr. Arnoldo Morale on 09/27/2018 for possible lap chole as outpatient.. low-fat diet.  As per general surgeon no need for further antibiotics   Discharge Condition: stable  Follow UP  Follow-up Information    Aviva Signs, MD Follow up.   Specialty:  General Surgery Why:  as needed Contact information: [id number] [facility] [address] [phone]            Consults obtained - Gen surg  Diet  and Activity recommendation:  As advised  Discharge Instructions    Discharge Instructions    Call MD for:  difficulty breathing, headache or visual disturbances   Complete by:  As directed    Call MD for:  persistant dizziness or light-headedness   Complete by:  As directed    Call MD for:  persistant nausea and vomiting   Complete by:  As directed    Call MD for:  severe uncontrolled pain   Complete by:  As directed    Call MD for:  temperature >100.4   Complete by:  As directed    Diet - low sodium heart healthy   Complete by:  As directed    Low fat   Discharge instructions   Complete by:  As directed    1)Avoid ibuprofen/Advil/Aleve/Motrin/Goody Powders/Naproxen/BC powders/Meloxicam/Diclofenac/Indomethacin and other Nonsteroidal anti-inflammatory  medications as these will make you more likely to bleed and can cause stomach ulcers, can also cause Kidney problems.   2) low-fat diet advised  3) follow-up with Dr. Arnoldo Morale on Wednesday, 09/27/2018 for gallbladder surgery  4) no food or drink after midnight on Tuesday, 09/26/2018   Increase activity slowly   Complete by:  As directed         Discharge Medications     Allergies as of 09/23/2018   No Known Allergies     Medication List    TAKE these medications   acetaminophen 325 MG tablet Commonly known as:  TYLENOL Take 2 tablets (650 mg total) by mouth every 6 (six) hours as needed for mild pain, fever or headache (or Fever >/= 101).   BLACK ELDERBERRY(BERRY-FLOWER) PO Take by mouth daily.   HYDROcodone-acetaminophen 5-325 MG tablet Commonly known as:  NORCO/VICODIN Take 1 tablet by mouth every 4 (four) hours as needed for moderate pain.   Nexplanon 68 MG Impl implant Generic drug:  etonogestrel 1 each by Subdermal route once.   ondansetron 4 MG tablet Commonly known as:  ZOFRAN Take 1 tablet (4 mg total) by mouth every 6 (six) hours as needed for nausea.   pantoprazole 40 MG tablet Commonly known as:  Protonix Take 1 tablet (40 mg total) by mouth daily.       Major procedures and Radiology Reports - PLEASE review detailed and final reports for all details, in brief -   Ct Abdomen Pelvis W Contrast  Result Date: 09/21/2018 CLINICAL DATA:  Patient with upper abdominal pain radiating to the umbilicus. EXAM: CT ABDOMEN AND PELVIS WITH CONTRAST TECHNIQUE: Multidetector CT imaging of the abdomen and pelvis was performed using the standard protocol following bolus administration of intravenous contrast. CONTRAST:  138m OMNIPAQUE IOHEXOL 300 MG/ML  SOLN COMPARISON:  None. FINDINGS: Lower chest: Normal heart size. Dependent atelectasis bilateral lower lobes. No pleural effusion. Hepatobiliary: Liver is normal in size and contour. Multiple gallstones. Wall thickening of  the gallbladder. No intrahepatic or extrahepatic biliary ductal dilatation. Mild heterogeneity along the portal veins within the liver (image 18; series 2). Pancreas: Unremarkable Spleen: Unremarkable Adrenals/Urinary Tract: Normal adrenal glands. Kidneys enhance symmetrically with contrast. No hydronephrosis. Urinary bladder is unremarkable. Stomach/Bowel: Sigmoid colonic diverticulosis. No CT evidence for acute diverticulitis. No abnormal bowel wall thickening or evidence for bowel obstruction. No free fluid or free intraperitoneal air. Small hiatal hernia. Normal morphology of the stomach. Vascular/Lymphatic: Normal caliber abdominal aorta. Peripheral calcified atherosclerotic plaque. No retroperitoneal lymphadenopathy. Reproductive: Uterus is unremarkable. Adnexal structures are unremarkable. Other: None. Musculoskeletal: Lumbar spine degenerative changes. No aggressive or acute appearing  osseous lesions. IMPRESSION: 1. Mild wall thickening of the gallbladder. Multiple gallstones. Findings are indeterminate however raise the possibility of cholecystitis. Recommend correlation with right upper quadrant ultrasound. 2. There is mild heterogeneous low attenuation along the course of the portal veins within the liver which may be secondary to intravenous hydration. Possibility of changes related to cholangitis not excluded. Recommend clinical and laboratory correlation. Electronically Signed   By: Lovey Newcomer M.D.   On: 09/21/2018 11:15   Mr 3d Recon At Scanner  Result Date: 09/22/2018 CLINICAL DATA:  Cholelithiasis. Nausea and vomiting. Jaundice. Abdominal pain. Trace pericholecystic fluid on the prior ultrasound. EXAM: MRI ABDOMEN WITHOUT AND WITH CONTRAST (INCLUDING MRCP) TECHNIQUE: Multiplanar multisequence MR imaging of the abdomen was performed both before and after the administration of intravenous contrast. Heavily T2-weighted images of the biliary and pancreatic ducts were obtained, and three-dimensional  MRCP images were rendered by post processing. CONTRAST:  10 cc Gadavist COMPARISON:  Multiple exams, including CT and ultrasound exams from 09/21/2018 FINDINGS: Despite efforts by the technologist and patient, motion artifact is present on today's exam and could not be eliminated. This reduces exam sensitivity and specificity. Lower chest: Unremarkable Hepatobiliary: Multiple gallstones in the gallbladder measuring up to 1.5 cm in diameter. Gallbladder wall thickening at 4 mm in diameter. Mildly accentuated enhancement along the gallbladder fossa which can be secondary to local inflammation in raises the possibility of cholecystitis. No significant filling defect is observed in the common bile duct or common hepatic duct and the common bile duct measures about 4 mm in diameter. She no compelling findings of cholangitis. No significant abnormal focal hepatic enhancement. Pancreas:  Unremarkable Spleen:  Unremarkable Adrenals/Urinary Tract:  Unremarkable Stomach/Bowel: Unremarkable Vascular/Lymphatic:  Unremarkable Other:  No supplemental non-categorized findings. Musculoskeletal: Unremarkable IMPRESSION: 1. Gallbladder wall thickening with multiple gallstones, and with some accentuated enhancement in the hepatic parenchyma along the gallbladder fossa raising the possibility of cholecystitis. No compelling findings of cholangitis and no choledocholithiasis identified. 2. No current compelling findings of periportal edema. Electronically Signed   By: Van Clines M.D.   On: 09/22/2018 10:15   US Abdomen Limited  Result Date: 09/21/2018 CLINICAL DATA:  Known gallstones with upper abdominal pain and nausea for 2 days. EXAM: ULTRASOUND ABDOMEN LIMITED RIGHT UPPER QUADRANT COMPARISON:  None. FINDINGS: Gallbladder: Multiple mobile gallstones, largest measuring 1.3 cm. Trace pericholecystic fluid. Gallbladder wall thickness is within normal limits. No sonographic Murphy's sign elicited during the exam, per the  sonographer. Common bile duct: Diameter: 5 mm Liver: No focal lesion identified. Within normal limits in parenchymal echogenicity. Portal vein is patent on color Doppler imaging with normal direction of blood flow towards the liver. IMPRESSION: 1. Multiple gallstones. Gallbladder wall thickness is within normal limits on this ultrasound, however, there is trace pericholecystic fluid and the appearance of the gallbladder on today's earlier CT is suspicious for an early acute cholecystitis. If additional imaging confirmation is needed, consider nuclear medicine HIDA scan. 2. No bile duct dilatation. 3. Liver appears normal. Electronically Signed   By: Franki Cabot M.D.   On: 09/21/2018 14:10   Mr Abdomen Mrcp Moise Boring Contast  Result Date: 09/22/2018 CLINICAL DATA:  Cholelithiasis. Nausea and vomiting. Jaundice. Abdominal pain. Trace pericholecystic fluid on the prior ultrasound. EXAM: MRI ABDOMEN WITHOUT AND WITH CONTRAST (INCLUDING MRCP) TECHNIQUE: Multiplanar multisequence MR imaging of the abdomen was performed both before and after the administration of intravenous contrast. Heavily T2-weighted images of the biliary and pancreatic ducts were obtained, and three-dimensional MRCP  images were rendered by post processing. CONTRAST:  10 cc Gadavist COMPARISON:  Multiple exams, including CT and ultrasound exams from 09/21/2018 FINDINGS: Despite efforts by the technologist and patient, motion artifact is present on today's exam and could not be eliminated. This reduces exam sensitivity and specificity. Lower chest: Unremarkable Hepatobiliary: Multiple gallstones in the gallbladder measuring up to 1.5 cm in diameter. Gallbladder wall thickening at 4 mm in diameter. Mildly accentuated enhancement along the gallbladder fossa which can be secondary to local inflammation in raises the possibility of cholecystitis. No significant filling defect is observed in the common bile duct or common hepatic duct and the common bile  duct measures about 4 mm in diameter. She no compelling findings of cholangitis. No significant abnormal focal hepatic enhancement. Pancreas:  Unremarkable Spleen:  Unremarkable Adrenals/Urinary Tract:  Unremarkable Stomach/Bowel: Unremarkable Vascular/Lymphatic:  Unremarkable Other:  No supplemental non-categorized findings. Musculoskeletal: Unremarkable IMPRESSION: 1. Gallbladder wall thickening with multiple gallstones, and with some accentuated enhancement in the hepatic parenchyma along the gallbladder fossa raising the possibility of cholecystitis. No compelling findings of cholangitis and no choledocholithiasis identified. 2. No current compelling findings of periportal edema. Electronically Signed   By: Van Clines M.D.   On: 09/22/2018 10:15    Today   Subjective    Regina Fisher today has no new complaints, eating and drinking okay, no further nausea or vomiting, abdominal pain is mostly resolved          Patient has been seen and examined prior to discharge   Objective   Blood pressure (!) 157/99, pulse 72, temperature 98.4 F (36.9 C), temperature source Oral, resp. rate 17, height 5' 4"  (1.626 m), weight 97.5 kg, SpO2 99 %.   Intake/Output Summary (Last 24 hours) at 09/23/2018 1024 Last data filed at 09/23/2018 0911 Gross per 24 hour  Intake 1379.55 ml  Output -  Net 1379.55 ml    Exam Gen:- Awake Alert,   HEENT:- [place].AT,  Neck-Supple Neck,No JVD,.  Lungs-  CTAB , fair symmetrical air movement CV- S1, S2 normal, regular  Abd-  +ve B.Sounds, Abd Soft, mostly resolved epigastric and right upper quadrant area tenderness Extremity/Skin:- No  edema, pedal pulses present Psych-affect is appropriate, oriented x3  Neuro-no new focal deficits, no tremors   Data Review   CBC w Diff:  Lab Results  Component Value Date   WBC 5.7 09/23/2018   HGB 12.5 09/23/2018   HCT 37.4 09/23/2018   PLT 343 09/23/2018    CMP:  Lab Results  Component Value Date   NA 138  09/23/2018   K 3.9 09/23/2018   CL 106 09/23/2018   CO2 24 09/23/2018   BUN 6 09/23/2018   CREATININE 0.76 09/23/2018   PROT 7.0 09/23/2018   ALBUMIN 3.5 09/23/2018   BILITOT 0.6 09/23/2018   ALKPHOS 139 (H) 09/23/2018   AST 99 (H) 09/23/2018   ALT 237 (H) 09/23/2018  .   Total Discharge time is about 33 minutes  Roxan Hockey M.D on 09/23/2018 at 10:24 AM  Go to [email or web address] -  for contact info  [facility] - Office  [phone]

## 2018-09-23 NOTE — Progress Notes (Signed)
Patient's IV catheter removed and intact. Pt's IV site clean dry and intact. Discharge instructions including medications and follow up appointments were reviewed and discussed with patient. All questions were answered and no further questions at this time. Pt in stable condition and in no acute distress at time of discharge. Pt escorted by nurse tech 

## 2018-09-23 NOTE — Progress Notes (Signed)
Subjective: Patient feels much better.  She is tolerating a low-fat diet well.  Objective: Vital signs in last 24 hours: Temp:  [98.4 F (36.9 C)-98.7 F (37.1 C)] 98.4 F (36.9 C) (03/07 0540) Pulse Rate:  [72-75] 72 (03/07 0540) Resp:  [17-18] 17 (03/07 0540) BP: (142-157)/(86-99) 157/99 (03/07 0540) SpO2:  [98 %-99 %] 99 % (03/07 0540) Last BM Date: 09/21/18  Intake/Output from previous day: 03/06 0701 - 03/07 0700 In: 1139.6 [P.O.:360; I.V.:729.6; IV Piggyback:50] Out: -  Intake/Output this shift: Total I/O In: 240 [P.O.:240] Out: -   General appearance: alert, cooperative and no distress GI: Soft with minimal tenderness in the right upper quadrant to palpation.  No rigidity is noted.  Lab Results:  Recent Labs    09/22/18 0440 09/23/18 0618  WBC 6.3 5.7  HGB 11.8* 12.5  HCT 35.4* 37.4  PLT 298 343   BMET Recent Labs    09/22/18 0440 09/23/18 0618  NA 137 138  K 4.4 3.9  CL 107 106  CO2 25 24  GLUCOSE 127* 103*  BUN 5* 6  CREATININE 0.64 0.76  CALCIUM 8.7* 9.0   PT/INR No results for input(s): LABPROT, INR in the last 72 hours.  Studies/Results: Ct Abdomen Pelvis W Contrast  Result Date: 09/21/2018 CLINICAL DATA:  Patient with upper abdominal pain radiating to the umbilicus. EXAM: CT ABDOMEN AND PELVIS WITH CONTRAST TECHNIQUE: Multidetector CT imaging of the abdomen and pelvis was performed using the standard protocol following bolus administration of intravenous contrast. CONTRAST:  171mL OMNIPAQUE IOHEXOL 300 MG/ML  SOLN COMPARISON:  None. FINDINGS: Lower chest: Normal heart size. Dependent atelectasis bilateral lower lobes. No pleural effusion. Hepatobiliary: Liver is normal in size and contour. Multiple gallstones. Wall thickening of the gallbladder. No intrahepatic or extrahepatic biliary ductal dilatation. Mild heterogeneity along the portal veins within the liver (image 18; series 2). Pancreas: Unremarkable Spleen: Unremarkable Adrenals/Urinary  Tract: Normal adrenal glands. Kidneys enhance symmetrically with contrast. No hydronephrosis. Urinary bladder is unremarkable. Stomach/Bowel: Sigmoid colonic diverticulosis. No CT evidence for acute diverticulitis. No abnormal bowel wall thickening or evidence for bowel obstruction. No free fluid or free intraperitoneal air. Small hiatal hernia. Normal morphology of the stomach. Vascular/Lymphatic: Normal caliber abdominal aorta. Peripheral calcified atherosclerotic plaque. No retroperitoneal lymphadenopathy. Reproductive: Uterus is unremarkable. Adnexal structures are unremarkable. Other: None. Musculoskeletal: Lumbar spine degenerative changes. No aggressive or acute appearing osseous lesions. IMPRESSION: 1. Mild wall thickening of the gallbladder. Multiple gallstones. Findings are indeterminate however raise the possibility of cholecystitis. Recommend correlation with right upper quadrant ultrasound. 2. There is mild heterogeneous low attenuation along the course of the portal veins within the liver which may be secondary to intravenous hydration. Possibility of changes related to cholangitis not excluded. Recommend clinical and laboratory correlation. Electronically Signed   By: Lovey Newcomer M.D.   On: 09/21/2018 11:15   Mr 3d Recon At Scanner  Result Date: 09/22/2018 CLINICAL DATA:  Cholelithiasis. Nausea and vomiting. Jaundice. Abdominal pain. Trace pericholecystic fluid on the prior ultrasound. EXAM: MRI ABDOMEN WITHOUT AND WITH CONTRAST (INCLUDING MRCP) TECHNIQUE: Multiplanar multisequence MR imaging of the abdomen was performed both before and after the administration of intravenous contrast. Heavily T2-weighted images of the biliary and pancreatic ducts were obtained, and three-dimensional MRCP images were rendered by post processing. CONTRAST:  10 cc Gadavist COMPARISON:  Multiple exams, including CT and ultrasound exams from 09/21/2018 FINDINGS: Despite efforts by the technologist and patient, motion  artifact is present on today's exam and could not be  eliminated. This reduces exam sensitivity and specificity. Lower chest: Unremarkable Hepatobiliary: Multiple gallstones in the gallbladder measuring up to 1.5 cm in diameter. Gallbladder wall thickening at 4 mm in diameter. Mildly accentuated enhancement along the gallbladder fossa which can be secondary to local inflammation in raises the possibility of cholecystitis. No significant filling defect is observed in the common bile duct or common hepatic duct and the common bile duct measures about 4 mm in diameter. She no compelling findings of cholangitis. No significant abnormal focal hepatic enhancement. Pancreas:  Unremarkable Spleen:  Unremarkable Adrenals/Urinary Tract:  Unremarkable Stomach/Bowel: Unremarkable Vascular/Lymphatic:  Unremarkable Other:  No supplemental non-categorized findings. Musculoskeletal: Unremarkable IMPRESSION: 1. Gallbladder wall thickening with multiple gallstones, and with some accentuated enhancement in the hepatic parenchyma along the gallbladder fossa raising the possibility of cholecystitis. No compelling findings of cholangitis and no choledocholithiasis identified. 2. No current compelling findings of periportal edema. Electronically Signed   By: Van Clines M.D.   On: 09/22/2018 10:15   US Abdomen Limited  Result Date: 09/21/2018 CLINICAL DATA:  Known gallstones with upper abdominal pain and nausea for 2 days. EXAM: ULTRASOUND ABDOMEN LIMITED RIGHT UPPER QUADRANT COMPARISON:  None. FINDINGS: Gallbladder: Multiple mobile gallstones, largest measuring 1.3 cm. Trace pericholecystic fluid. Gallbladder wall thickness is within normal limits. No sonographic Murphy's sign elicited during the exam, per the sonographer. Common bile duct: Diameter: 5 mm Liver: No focal lesion identified. Within normal limits in parenchymal echogenicity. Portal vein is patent on color Doppler imaging with normal direction of blood flow towards  the liver. IMPRESSION: 1. Multiple gallstones. Gallbladder wall thickness is within normal limits on this ultrasound, however, there is trace pericholecystic fluid and the appearance of the gallbladder on today's earlier CT is suspicious for an early acute cholecystitis. If additional imaging confirmation is needed, consider nuclear medicine HIDA scan. 2. No bile duct dilatation. 3. Liver appears normal. Electronically Signed   By: Franki Cabot M.D.   On: 09/21/2018 14:10   Mr Abdomen Mrcp Moise Boring Contast  Result Date: 09/22/2018 CLINICAL DATA:  Cholelithiasis. Nausea and vomiting. Jaundice. Abdominal pain. Trace pericholecystic fluid on the prior ultrasound. EXAM: MRI ABDOMEN WITHOUT AND WITH CONTRAST (INCLUDING MRCP) TECHNIQUE: Multiplanar multisequence MR imaging of the abdomen was performed both before and after the administration of intravenous contrast. Heavily T2-weighted images of the biliary and pancreatic ducts were obtained, and three-dimensional MRCP images were rendered by post processing. CONTRAST:  10 cc Gadavist COMPARISON:  Multiple exams, including CT and ultrasound exams from 09/21/2018 FINDINGS: Despite efforts by the technologist and patient, motion artifact is present on today's exam and could not be eliminated. This reduces exam sensitivity and specificity. Lower chest: Unremarkable Hepatobiliary: Multiple gallstones in the gallbladder measuring up to 1.5 cm in diameter. Gallbladder wall thickening at 4 mm in diameter. Mildly accentuated enhancement along the gallbladder fossa which can be secondary to local inflammation in raises the possibility of cholecystitis. No significant filling defect is observed in the common bile duct or common hepatic duct and the common bile duct measures about 4 mm in diameter. She no compelling findings of cholangitis. No significant abnormal focal hepatic enhancement. Pancreas:  Unremarkable Spleen:  Unremarkable Adrenals/Urinary Tract:  Unremarkable  Stomach/Bowel: Unremarkable Vascular/Lymphatic:  Unremarkable Other:  No supplemental non-categorized findings. Musculoskeletal: Unremarkable IMPRESSION: 1. Gallbladder wall thickening with multiple gallstones, and with some accentuated enhancement in the hepatic parenchyma along the gallbladder fossa raising the possibility of cholecystitis. No compelling findings of cholangitis and no choledocholithiasis identified. 2. No  current compelling findings of periportal edema. Electronically Signed   By: Van Clines M.D.   On: 09/22/2018 10:15    Anti-infectives: Anti-infectives (From admission, onward)   Start     Dose/Rate Route Frequency Ordered Stop   09/22/18 0600  cefTRIAXone (ROCEPHIN) 1 g in sodium chloride 0.9 % 100 mL IVPB  Status:  Discontinued     1 g 200 mL/hr over 30 Minutes Intravenous Every 24 hours 09/21/18 1337 09/21/18 1342   09/21/18 1400  cefTRIAXone (ROCEPHIN) 2 g in sodium chloride 0.9 % 100 mL IVPB     2 g 200 mL/hr over 30 Minutes Intravenous Every 24 hours 09/21/18 1342     09/21/18 1345  cefTRIAXone (ROCEPHIN) 1 g in sodium chloride 0.9 % 100 mL IVPB  Status:  Discontinued     1 g 200 mL/hr over 30 Minutes Intravenous  Once 09/21/18 1334 09/21/18 1343      Assessment/Plan: Impression: Cholecystitis, cholelithiasis.  Symptoms are resolving. Plan: Okay for discharge.  Will have laparoscopic cholecystectomy as an outpatient on 09/27/2018.  The risks and benefits of the procedure including bleeding, infection, hepatobiliary injury, and the possibility of an open procedure were fully explained to the patient, who gave informed consent.  LOS: 2 days    Aviva Signs 09/23/2018

## 2018-09-23 NOTE — Progress Notes (Signed)
Pt complains of "migraine since yesterday." Pt also states "tylenol not working." notified dr. Darrick Meigs via [facility] page system that pt requests migraine mgmt. Order for percocet already exist. Pt refuses this treatment. Pt typically uses essential oils for treatment. Essential oils unavailable at this time on this unit. Will continue to monitor.

## 2018-09-25 ENCOUNTER — Encounter (HOSPITAL_COMMUNITY): Payer: Self-pay

## 2018-09-26 ENCOUNTER — Encounter (HOSPITAL_COMMUNITY)
Admission: RE | Admit: 2018-09-26 | Discharge: 2018-09-26 | Disposition: A | Discharge status: Home / Self Care | Payer: PRIVATE HEALTH INSURANCE | Source: Ambulatory Visit | Attending: General Surgery | Admitting: General Surgery

## 2018-09-27 ENCOUNTER — Ambulatory Visit (HOSPITAL_COMMUNITY)
Admission: RE | Admit: 2018-09-27 | Discharge: 2018-09-27 | Disposition: A | Discharge status: Home / Self Care | Payer: PRIVATE HEALTH INSURANCE | Attending: General Surgery | Admitting: General Surgery

## 2018-09-27 ENCOUNTER — Encounter (HOSPITAL_COMMUNITY)
Admission: RE | Disposition: A | Discharge status: Home / Self Care | Payer: Self-pay | Source: Home / Self Care | Attending: General Surgery

## 2018-09-27 ENCOUNTER — Other Ambulatory Visit: Payer: Self-pay

## 2018-09-27 ENCOUNTER — Ambulatory Visit (HOSPITAL_COMMUNITY): Payer: PRIVATE HEALTH INSURANCE | Admitting: Anesthesiology

## 2018-09-27 ENCOUNTER — Encounter (HOSPITAL_COMMUNITY): Payer: Self-pay

## 2018-09-27 DIAGNOSIS — E669 Obesity, unspecified: Secondary | ICD-10-CM | POA: Diagnosis not present

## 2018-09-27 DIAGNOSIS — K219 Gastro-esophageal reflux disease without esophagitis: Secondary | ICD-10-CM | POA: Insufficient documentation

## 2018-09-27 DIAGNOSIS — K8000 Calculus of gallbladder with acute cholecystitis without obstruction: Secondary | ICD-10-CM | POA: Diagnosis not present

## 2018-09-27 DIAGNOSIS — K8012 Calculus of gallbladder with acute and chronic cholecystitis without obstruction: Secondary | ICD-10-CM | POA: Insufficient documentation

## 2018-09-27 DIAGNOSIS — K8010 Calculus of gallbladder with chronic cholecystitis without obstruction: Secondary | ICD-10-CM | POA: Diagnosis present

## 2018-09-27 DIAGNOSIS — Z6836 Body mass index (BMI) 36.0-36.9, adult: Secondary | ICD-10-CM | POA: Diagnosis not present

## 2018-09-27 HISTORY — PX: CHOLECYSTECTOMY: SHX55

## 2018-09-27 SURGERY — LAPAROSCOPIC CHOLECYSTECTOMY
Anesthesia: General

## 2018-09-27 MED ORDER — SUCCINYLCHOLINE CHLORIDE 200 MG/10ML IV SOSY
PREFILLED_SYRINGE | INTRAVENOUS | Status: AC
  Filled 2018-09-27: qty 20

## 2018-09-27 MED ORDER — ONDANSETRON HCL 4 MG/2ML IJ SOLN
INTRAMUSCULAR | Status: DC | PRN
  Administered 2018-09-27: 4 mg via INTRAVENOUS

## 2018-09-27 MED ORDER — FENTANYL CITRATE (PF) 100 MCG/2ML IJ SOLN
INTRAMUSCULAR | Status: DC | PRN
  Administered 2018-09-27 (×6): 50 ug via INTRAVENOUS

## 2018-09-27 MED ORDER — SUCCINYLCHOLINE 20MG/ML (10ML) SYRINGE FOR MEDFUSION PUMP - OPTIME
INTRAMUSCULAR | Status: DC | PRN
  Administered 2018-09-27: 120 mg via INTRAVENOUS
  Administered 2018-09-27: 10 mg via INTRAVENOUS

## 2018-09-27 MED ORDER — SODIUM CHLORIDE 0.9 % IR SOLN
Status: DC | PRN
  Administered 2018-09-27: 1000 mL

## 2018-09-27 MED ORDER — HEMOSTATIC AGENTS (NO CHARGE) OPTIME
TOPICAL | Status: DC | PRN
  Administered 2018-09-27: 1 via TOPICAL

## 2018-09-27 MED ORDER — PROMETHAZINE HCL 25 MG/ML IJ SOLN: 6.2500 mg | INTRAMUSCULAR | Status: DC | PRN

## 2018-09-27 MED ORDER — CHLORHEXIDINE GLUCONATE CLOTH 2 % EX PADS: 6.0000 | MEDICATED_PAD | Freq: Once | CUTANEOUS | Status: DC

## 2018-09-27 MED ORDER — MIDAZOLAM HCL 2 MG/2ML IJ SOLN
INTRAMUSCULAR | Status: AC
  Filled 2018-09-27: qty 2

## 2018-09-27 MED ORDER — LACTATED RINGERS IV SOLN
INTRAVENOUS | Status: DC
  Administered 2018-09-27: 10:00:00 via INTRAVENOUS

## 2018-09-27 MED ORDER — FENTANYL CITRATE (PF) 100 MCG/2ML IJ SOLN
INTRAMUSCULAR | Status: AC
  Filled 2018-09-27: qty 2

## 2018-09-27 MED ORDER — MIDAZOLAM HCL 2 MG/2ML IJ SOLN: 0.5000 mg | Freq: Once | INTRAMUSCULAR | Status: DC | PRN

## 2018-09-27 MED ORDER — ROCURONIUM BROMIDE 10 MG/ML (PF) SYRINGE
PREFILLED_SYRINGE | INTRAVENOUS | Status: AC
  Filled 2018-09-27: qty 20

## 2018-09-27 MED ORDER — BUPIVACAINE LIPOSOME 1.3 % IJ SUSP
INTRAMUSCULAR | Status: AC
  Filled 2018-09-27: qty 20

## 2018-09-27 MED ORDER — HYDROCODONE-ACETAMINOPHEN 7.5-325 MG PO TABS
1.0000 | ORAL_TABLET | Freq: Once | ORAL | Status: AC | PRN
  Administered 2018-09-27: 1 via ORAL
  Filled 2018-09-27: qty 1

## 2018-09-27 MED ORDER — CIPROFLOXACIN IN D5W 400 MG/200ML IV SOLN
400.0000 mg | INTRAVENOUS | Status: AC
  Administered 2018-09-27: 400 mg via INTRAVENOUS
  Filled 2018-09-27: qty 200

## 2018-09-27 MED ORDER — SUGAMMADEX SODIUM 200 MG/2ML IV SOLN
INTRAVENOUS | Status: DC | PRN
  Administered 2018-09-27: 200 mg via INTRAVENOUS

## 2018-09-27 MED ORDER — POVIDONE-IODINE 10 % OINT PACKET
TOPICAL_OINTMENT | CUTANEOUS | Status: DC | PRN
  Administered 2018-09-27: 1 via TOPICAL

## 2018-09-27 MED ORDER — HYDROMORPHONE HCL 1 MG/ML IJ SOLN
0.2500 mg | INTRAMUSCULAR | Status: DC | PRN
  Administered 2018-09-27: 0.5 mg via INTRAVENOUS
  Filled 2018-09-27: qty 0.5

## 2018-09-27 MED ORDER — PROPOFOL 10 MG/ML IV BOLUS
INTRAVENOUS | Status: DC | PRN
  Administered 2018-09-27: 180 mg via INTRAVENOUS
  Administered 2018-09-27: 40 mg via INTRAVENOUS

## 2018-09-27 MED ORDER — BUPIVACAINE LIPOSOME 1.3 % IJ SUSP
INTRAMUSCULAR | Status: DC | PRN
  Administered 2018-09-27: 20 mL

## 2018-09-27 MED ORDER — POVIDONE-IODINE 10 % EX OINT
TOPICAL_OINTMENT | CUTANEOUS | Status: AC
  Filled 2018-09-27: qty 1

## 2018-09-27 MED ORDER — SEVOFLURANE IN SOLN
RESPIRATORY_TRACT | Status: AC
  Filled 2018-09-27: qty 250

## 2018-09-27 MED ORDER — SUGAMMADEX SODIUM 200 MG/2ML IV SOLN
INTRAVENOUS | Status: AC
  Filled 2018-09-27: qty 2

## 2018-09-27 MED ORDER — KETOROLAC TROMETHAMINE 30 MG/ML IJ SOLN
30.0000 mg | Freq: Once | INTRAMUSCULAR | Status: AC
  Administered 2018-09-27: 30 mg via INTRAVENOUS
  Filled 2018-09-27: qty 1

## 2018-09-27 MED ORDER — ROCURONIUM 10MG/ML (10ML) SYRINGE FOR MEDFUSION PUMP - OPTIME
INTRAVENOUS | Status: DC | PRN
  Administered 2018-09-27: 10 mg via INTRAVENOUS

## 2018-09-27 MED ORDER — SUGAMMADEX SODIUM 500 MG/5ML IV SOLN
INTRAVENOUS | Status: AC
  Filled 2018-09-27: qty 5

## 2018-09-27 MED ORDER — MIDAZOLAM HCL 5 MG/5ML IJ SOLN
INTRAMUSCULAR | Status: DC | PRN
  Administered 2018-09-27: 2 mg via INTRAVENOUS

## 2018-09-27 SURGICAL SUPPLY — 46 items
APPLIER CLIP ROT 10 11.4 M/L (STAPLE) ×3
APR CLP MED LRG 11.4X10 (STAPLE) ×1
BAG RETRIEVAL 10 (BASKET) ×1
BAG RETRIEVAL 10MM (BASKET) ×1
CHLORAPREP W/TINT 26ML (MISCELLANEOUS) ×3 IMPLANT
CLIP APPLIE ROT 10 11.4 M/L (STAPLE) ×1 IMPLANT
CLOTH BEACON ORANGE TIMEOUT ST (SAFETY) ×3 IMPLANT
COVER LIGHT HANDLE STERIS (MISCELLANEOUS) ×6 IMPLANT
COVER WAND RF STERILE (DRAPES) ×2 IMPLANT
ELECT REM PT RETURN 9FT ADLT (ELECTROSURGICAL) ×3
ELECTRODE REM PT RTRN 9FT ADLT (ELECTROSURGICAL) ×1 IMPLANT
FILTER SMOKE EVAC LAPAROSHD (FILTER) ×3 IMPLANT
GLOVE BIO SURGEON STRL SZ7 (GLOVE) ×4 IMPLANT
GLOVE BIOGEL PI IND STRL 7.0 (GLOVE) ×1 IMPLANT
GLOVE BIOGEL PI INDICATOR 7.0 (GLOVE) ×2
GLOVE SURG SS PI 7.5 STRL IVOR (GLOVE) ×3 IMPLANT
GOWN STRL REUS W/TWL LRG LVL3 (GOWN DISPOSABLE) ×9 IMPLANT
HEMOSTAT SNOW SURGICEL 2X4 (HEMOSTASIS) ×3 IMPLANT
INST SET LAPROSCOPIC AP (KITS) ×3 IMPLANT
KIT TURNOVER KIT A (KITS) ×3 IMPLANT
MANIFOLD NEPTUNE II (INSTRUMENTS) ×3 IMPLANT
NDL HYPO 18GX1.5 BLUNT FILL (NEEDLE) ×1 IMPLANT
NDL INSUFFLATION 14GA 120MM (NEEDLE) ×1 IMPLANT
NEEDLE HYPO 18GX1.5 BLUNT FILL (NEEDLE) ×3 IMPLANT
NEEDLE HYPO 22GX1.5 SAFETY (NEEDLE) ×3 IMPLANT
NEEDLE INSUFFLATION 14GA 120MM (NEEDLE) ×3 IMPLANT
NS IRRIG 1000ML POUR BTL (IV SOLUTION) ×3 IMPLANT
PACK LAP CHOLE LZT030E (CUSTOM PROCEDURE TRAY) ×3 IMPLANT
PAD ARMBOARD 7.5X6 YLW CONV (MISCELLANEOUS) ×3 IMPLANT
SET BASIN LINEN APH (SET/KITS/TRAYS/PACK) ×3 IMPLANT
SLEEVE ENDOPATH XCEL 5M (ENDOMECHANICALS) ×3 IMPLANT
SPONGE GAUZE 2X2 8PLY STER LF (GAUZE/BANDAGES/DRESSINGS) ×4
SPONGE GAUZE 2X2 8PLY STRL LF (GAUZE/BANDAGES/DRESSINGS) ×8 IMPLANT
STAPLER VISISTAT (STAPLE) ×3 IMPLANT
SUT VICRYL 0 UR6 27IN ABS (SUTURE) ×3 IMPLANT
SYR 20CC LL (SYRINGE) ×3 IMPLANT
SYS BAG RETRIEVAL 10MM (BASKET) ×1
SYSTEM BAG RETRIEVAL 10MM (BASKET) ×1 IMPLANT
TAPE CLOTH SURG 4X10 WHT LF (GAUZE/BANDAGES/DRESSINGS) ×2 IMPLANT
TROCAR ENDO BLADELESS 11MM (ENDOMECHANICALS) ×3 IMPLANT
TROCAR XCEL NON-BLD 5MMX100MML (ENDOMECHANICALS) ×3 IMPLANT
TROCAR XCEL UNIV SLVE 11M 100M (ENDOMECHANICALS) ×3 IMPLANT
TUBE CONNECTING 12'X1/4 (SUCTIONS) ×1
TUBE CONNECTING 12X1/4 (SUCTIONS) ×2 IMPLANT
TUBING INSUFFLATION (TUBING) ×3 IMPLANT
WARMER LAPAROSCOPE (MISCELLANEOUS) ×3 IMPLANT

## 2018-09-27 NOTE — H&P (Signed)
Reason for Consult: Cholecystitis, cholelithiasis Referring Physician: Dr. Marina Goodell Regina Fisher is an 43 y.o. female.  HPI: Patient is a 43 year old white female who presented to the emergency room with worsening right upper quadrant abdominal pain, nausea, and vomiting.  She had a previous episode 1 week ago but it resolved spontaneously.  No fever, chills, jaundice have been noted.  In the emergency room, she was noted to have transaminitis but a normal white blood cell count.  CT scan of the abdomen revealed cholecystitis with cholelithiasis.  She was admitted to the hospital for further evaluation and treatment.  This morning, her total bilirubin had increased with her transaminases still elevated.  An MRCP was ordered and this was negative for choledocholithiasis.  Patient states she is hungry and still has right upper quadrant abdominal pain.  She rates her pain as 8 out of 10.  Past Medical History:  Diagnosis Date  . Tumor of optic nerve     Past Surgical History:  Procedure Laterality Date  . bone spur    . fatty turmor removal back    . gamma knife 2018    . ganna knight    . TENDON REPAIR      Family History  Problem Relation Age of Onset  . Osteoporosis Maternal Grandmother   . Hypertension Mother   . Mental illness Brother     Social History:  reports that she has never smoked. She has never used smokeless tobacco. She reports that she does not drink alcohol or use drugs.  Allergies: No Known Allergies  Medications: I have reviewed the patient's current medications.  No results found for this or any previous visit (from the past 48 hour(s)).  No results found.  ROS:  Pertinent items are noted in HPI.  There were no vitals taken for this visit. Physical Exam: Anxious obese white female Head is normocephalic, atraumatic Lungs clear to auscultation with good breath sounds bilaterally Heart examination reveals regular rate and rhythm without S3, S4,  murmurs Abdomen is soft with tenderness in the right upper quadrant to palpation.  No rigidity is noted.  Difficult to assess hepatosplenomegaly due to body habitus.  Labs and x-ray results reviewed  Assessment/Plan: Impression: Cholecystitis with cholelithiasis.  No evidence of choledocholithiasis Plan: I told the patient that she will require laparoscopic cholecystectomy once her liver enzyme tests have improved.  Patient expressed frustration with the whole process.  We will give low-fat diet.  Will increase her IV Dilaudid.  Will reassess her labs in the morning.  Regina Fisher Addendum: The patient is having outpatient laparoscopic cholecystectomy on 09/27/2018.  The risks and benefits of the procedure including bleeding, infection, hepatobiliary injury, and the possibility of an open procedure were fully explained to the patient, who gave informed consent.

## 2018-09-27 NOTE — Transfer of Care (Signed)
Immediate Anesthesia Transfer of Care Note  Patient: Regina Fisher  Procedure(s) Performed: LAPAROSCOPIC CHOLECYSTECTOMY (N/A )  Patient Location: PACU  Anesthesia Type:General  Level of Consciousness: awake and alert   Airway & Oxygen Therapy: Patient Spontanous Breathing and Patient connected to nasal cannula oxygen  Post-op Assessment: Post -op Vital signs reviewed and stable  Post vital signs: Reviewed and stable  Last Vitals:  Vitals Value Taken Time  BP 171/110 09/27/2018 11:51 AM  Temp    Pulse 90 09/27/2018 11:55 AM  Resp 17 09/27/2018 11:55 AM  SpO2 96 % 09/27/2018 11:55 AM  Vitals shown include unvalidated device data.  Last Pain:  Vitals:   09/27/18 0904  TempSrc: Oral  PainSc: 0-No pain      Patients Stated Pain Goal: 6 (28/78/67 6720)  Complications: No apparent anesthesia complications

## 2018-09-27 NOTE — Interval H&P Note (Signed)
History and Physical Interval Note:  09/27/2018 10:07 AM  Regina Fisher  has presented today for surgery, with the diagnosis of cholecystitis, cholelithiasis.  The various methods of treatment have been discussed with the patient and family. After consideration of risks, benefits and other options for treatment, the patient has consented to  Procedure(s): LAPAROSCOPIC CHOLECYSTECTOMY (N/A) as a surgical intervention.  The patient's history has been reviewed, patient examined, no change in status, stable for surgery.  I have reviewed the patient's chart and labs.  Questions were answered to the patient's satisfaction.     Aviva Signs

## 2018-09-27 NOTE — Anesthesia Postprocedure Evaluation (Signed)
Anesthesia Post Note  Patient: Regina Fisher  Procedure(s) Performed: LAPAROSCOPIC CHOLECYSTECTOMY (N/A )  Patient location during evaluation: PACU Anesthesia Type: General Level of consciousness: awake and alert and oriented Vital Signs Assessment: post-procedure vital signs reviewed and stable Respiratory status: spontaneous breathing Cardiovascular status: stable Postop Assessment: no apparent nausea or vomiting Anesthetic complications: no     Last Vitals:  Vitals:   09/27/18 1231 09/27/18 1245  BP: (!) 154/109 (!) 150/90  Pulse: 83 75  Resp: 13 13  Temp:    SpO2: 94% 96%    Last Pain:  Vitals:   09/27/18 1245  TempSrc:   PainSc: 5                  ADAMS, AMY A

## 2018-09-27 NOTE — Anesthesia Procedure Notes (Signed)
Procedure Name: Intubation Date/Time: 09/27/2018 10:56 AM Performed by: Ollen Bowl, CRNA Pre-anesthesia Checklist: Patient identified, Patient being monitored, Timeout performed, Emergency Drugs available and Suction available Patient Re-evaluated:Patient Re-evaluated prior to induction Oxygen Delivery Method: Circle System Utilized Preoxygenation: Pre-oxygenation with 100% oxygen Induction Type: IV induction Ventilation: Mask ventilation without difficulty Laryngoscope Size: Mac and 3 Grade View: Grade III Tube type: Oral Tube size: 7.0 mm Number of attempts: 2 Airway Equipment and Method: stylet Placement Confirmation: ETT inserted through vocal cords under direct vision,  positive ETCO2 and breath sounds checked- equal and bilateral Secured at: 22 cm Tube secured with: Tape Dental Injury: Teeth and Oropharynx as per pre-operative assessment  Comments: One attempt by CRNA unsuccessful, first attempt by Anesthesiologist successful with grade 3 view.  Neck mobility somewhat limited.  Recommend glidescope.

## 2018-09-27 NOTE — Discharge Instructions (Signed)
Laparoscopic Cholecystectomy, Care After  This sheet gives you information about how to care for yourself after your procedure. Your health care provider may also give you more specific instructions. If you have problems or questions, contact your health care provider.  What can I expect after the procedure?  After the procedure, it is common to have:   Pain at your incision sites. You will be given medicines to control this pain.   Mild nausea or vomiting.    Bloating and possible shoulder pain from the air-like gas that was used during the procedure.  Follow these instructions at home:  Incision care     Follow instructions from your health care provider about how to take care of your incisions. Make sure you:  ? Wash your hands with soap and water before you change your bandage (dressing). If soap and water are not available, use hand sanitizer.  ? Change your dressing as told by your health care provider.  ? Leave stitches (sutures), skin glue, or adhesive strips in place. These skin closures may need to be in place for 2 weeks or longer. If adhesive strip edges start to loosen and curl up, you may trim the loose edges. Do not remove adhesive strips completely unless your health care provider tells you to do that.   Do not take baths, swim, or use a hot tub until your health care provider approves. Ask your health care provider if you can take showers. You may only be allowed to take sponge baths for bathing.   Check your incision area every day for signs of infection. Check for:  ? More redness, swelling, or pain.  ? More fluid or blood.  ? Warmth.  ? Pus or a bad smell.  Activity   Do not drive or use heavy machinery while taking prescription pain medicine.   Do not lift anything that is heavier than 10 lb (4.5 kg) until your health care provider approves.   Do not play contact sports until your health care provider approves.   Do not drive for 24 hours if you were given a medicine to help you relax  (sedative).   Rest as needed. Do not return to work or school until your health care provider approves.  General instructions   Take over-the-counter and prescription medicines only as told by your health care provider.   To prevent or treat constipation while you are taking prescription pain medicine, your health care provider may recommend that you:  ? Drink enough fluid to keep your urine clear or pale yellow.  ? Take over-the-counter or prescription medicines.  ? Eat foods that are high in fiber, such as fresh fruits and vegetables, whole grains, and beans.  ? Limit foods that are high in fat and processed sugars, such as fried and sweet foods.  Contact a health care provider if:   You develop a rash.   You have more redness, swelling, or pain around your incisions.   You have more fluid or blood coming from your incisions.   Your incisions feel warm to the touch.   You have pus or a bad smell coming from your incisions.   You have a fever.   One or more of your incisions breaks open.  Get help right away if:   You have trouble breathing.   You have chest pain.   You have increasing pain in your shoulders.   You faint or feel dizzy when you stand.   You have   severe pain in your abdomen.   You have nausea or vomiting that lasts for more than one day.   You have leg pain.  This information is not intended to replace advice given to you by your health care provider. Make sure you discuss any questions you have with your health care provider.  Document Released: 07/05/2005 Document Revised: 01/24/2016 Document Reviewed: 12/22/2015  Elsevier Interactive Patient Education  2019 [employer]

## 2018-09-27 NOTE — Anesthesia Preprocedure Evaluation (Signed)
Anesthesia Evaluation  Patient identified by MRN, date of birth, ID band Patient awake    Reviewed: Allergy & Precautions, NPO status , Patient's Chart, lab work & pertinent test results  Airway Mallampati: II  TM Distance: >3 FB Neck ROM: Full    Dental no notable dental hx. (+) Teeth Intact   Pulmonary neg pulmonary ROS,    Pulmonary exam normal breath sounds clear to auscultation       Cardiovascular Exercise Tolerance: Good negative cardio ROS Normal cardiovascular examI Rhythm:Regular Rate:Normal     Neuro/Psych negative neurological ROS  negative psych ROS   GI/Hepatic Neg liver ROS, GERD  Medicated and Controlled,Denies any Sx of GERD -just recently started on meds   Endo/Other  negative endocrine ROS  Renal/GU negative Renal ROS  negative genitourinary   Musculoskeletal negative musculoskeletal ROS (+)   Abdominal   Peds negative pediatric ROS (+)  Hematology negative hematology ROS (+)   Anesthesia Other Findings   Reproductive/Obstetrics negative OB ROS                             Anesthesia Physical Anesthesia Plan  ASA: II  Anesthesia Plan: General   Post-op Pain Management:    Induction: Intravenous  PONV Risk Score and Plan:   Airway Management Planned: Oral ETT  Additional Equipment:   Intra-op Plan:   Post-operative Plan: Extubation in OR  Informed Consent: I have reviewed the patients History and Physical, chart, labs and discussed the procedure including the risks, benefits and alternatives for the proposed anesthesia with the patient or authorized representative who has indicated his/her understanding and acceptance.     Dental advisory given  Plan Discussed with: CRNA  Anesthesia Plan Comments:         Anesthesia Quick Evaluation

## 2018-09-27 NOTE — Op Note (Signed)
Patient:  Regina Fisher  DOB:  12-Nov-1975  MRN:  [id number]   Preop Diagnosis: Acute cholecystitis, cholelithiasis  Postop Diagnosis: Same  Procedure: Laparoscopic cholecystectomy  Surgeon: Aviva Signs, MD  Anes: General endotracheal  Indications: Patient is a 43 year old white female who was recently diagnosed with acute cholecystitis secondary to cholelithiasis.  The risks and benefits of the procedure including bleeding, infection, hepatobiliary injury, and the possibility of an open procedure were fully explained to the patient, who gave informed consent.  Procedure note: The patient was placed in supine position.  After induction of general endotracheal anesthesia, the abdomen was prepped and draped using the usual sterile technique with ChloraPrep.  Surgical site confirmation was performed.  A supraumbilical incision was made down to the fascia.  A Veress needle was introduced into the abdominal cavity and confirmation of placement was done using the saline drop test.  The abdomen was then insufflated to 16 mmHg pressure.  An 11 mm trocar was introduced into the abdominal cavity under direct visualization without difficulty.  The patient was placed in reverse Trendelenburg position and an additional 1 mm trocar was placed in the epigastric region and 5 mm trochars were placed the right upper quadrant and right flank regions.  Liver was inspected and noted to be within normal limits.  The gallbladder was retracted in a dynamic fashion in order to provide a critical view of the triangle of Calot.  The cystic duct was first identified.  Its juncture to the infundibulum was fully identified.  Endoclips were placed proximally and distally on the cystic duct, and the cystic duct was divided.  This was likewise done the cystic artery.  The gallbladder was freed away from the gallbladder fossa using Bovie electrocautery.  The gallbladder was delivered through the epigastric trocar site using an  Endo Catch bag.  The gallbladder fossa was inspected and no abnormal bleeding or bile leakage was noted.  Surgicel was placed in the gallbladder fossa.  All fluid and air were then evacuated from the abdominal cavity prior to the removal of the trochars.  All wounds were irrigated with normal saline.  All wounds were injected with Exparel.  The epigastric fascia was reapproximated using an 0 Vicryl interrupted suture.  All skin incisions were closed using staples.  Betadine ointment and dry sterile dressings were applied.  All tape and needle counts were correct at the end of the procedure.  The patient was extubated in the operating room and transferred to PACU in stable condition.  Complications: None  EBL: Minimal  Specimen: Gallbladder

## 2018-09-29 ENCOUNTER — Encounter (HOSPITAL_COMMUNITY): Payer: Self-pay | Admitting: General Surgery

## 2018-10-05 ENCOUNTER — Other Ambulatory Visit: Payer: Self-pay

## 2018-10-05 ENCOUNTER — Encounter: Payer: Self-pay | Admitting: General Surgery

## 2018-10-05 ENCOUNTER — Ambulatory Visit (INDEPENDENT_AMBULATORY_CARE_PROVIDER_SITE_OTHER): Payer: Self-pay | Admitting: General Surgery

## 2018-10-05 VITALS — BP 145/92 | HR 84 | Temp 99.0°F | Resp 20 | Wt 214.4 lb

## 2018-10-05 DIAGNOSIS — Z09 Encounter for follow-up examination after completed treatment for conditions other than malignant neoplasm: Secondary | ICD-10-CM

## 2018-10-05 NOTE — Progress Notes (Signed)
Subjective:     Regina Fisher  Here for postoperative visit.  Doing well.  Has no complaints. Objective:    BP (!) 145/92 (BP Location: Left Arm, Patient Position: Sitting, Cuff Size: Normal)   Pulse 84   Temp 99 F (37.2 C) (Oral)   Resp 20   Wt 214 lb 6.4 oz (97.3 kg)   BMI 36.80 kg/m   General:  alert, cooperative and no distress  Abdomen soft, incisions healing well.  Staples removed, Steri-Strips applied. Final pathology consistent with diagnosis.     Assessment:    Doing well postoperatively.    Plan:   Increase activity as able.  Follow-up here as needed.

## 2019-05-18 ENCOUNTER — Telehealth: Payer: Self-pay | Admitting: Adult Health

## 2019-05-18 NOTE — Telephone Encounter (Signed)

## 2019-05-21 ENCOUNTER — Ambulatory Visit (INDEPENDENT_AMBULATORY_CARE_PROVIDER_SITE_OTHER): Payer: BC Managed Care – PPO | Admitting: Adult Health

## 2019-05-21 ENCOUNTER — Other Ambulatory Visit: Payer: Self-pay

## 2019-05-21 ENCOUNTER — Encounter: Payer: Self-pay | Admitting: Adult Health

## 2019-05-21 VITALS — BP 124/84 | HR 75 | Ht 63.0 in | Wt 223.0 lb

## 2019-05-21 DIAGNOSIS — Z3046 Encounter for surveillance of implantable subdermal contraceptive: Secondary | ICD-10-CM | POA: Diagnosis not present

## 2019-05-21 NOTE — Progress Notes (Signed)
  Subjective:     Patient ID: Regina Fisher, female   DOB: 03/29/1976, 43 y.o.   MRN: [id number]  HPI Regina Fisher is a 43 year old white female, married, G0P0 in for nexplanon removal PCP is Dr Gerarda Fraction.   Review of Systems For nexplanon  removal Reviewed past medical,surgical, social and family history. Reviewed medications and allergies.     Objective:   Physical Exam BP 124/84 (BP Location: Right Arm, Cuff Size: Normal)   Pulse 75   Ht 5\' 3"  (1.6 m)   Wt 223 lb (101.2 kg)   LMP 05/20/2019 (Exact Date)   BMI 39.50 kg/m  Consent signed, time out called. Left arm cleansed with betadine, and injected with 1.5 cc 1% lidocaine and waited til numb.Under sterile technique a #11 blade was used to make small vertical incision, and a curved forceps was used to easily remove rod. Steri strips applied. Pressure dressing applied.   Fall risk is low. PHQ 2 score 0.  Assessment:     1. Encounter for Nexplanon removal       Plan:     Use condoms, keep clean and dry x 24 hours, no heavy lifting, keep steri strips on x 72 hours, Keep pressure dressing on x 24 hours. Follow up prn problems   Pap and physical 07/10/19 Number given for [facility] for husband to get vasectomy

## 2019-05-21 NOTE — Patient Instructions (Signed)
Use condoms, keep clean and dry x 24 hours, no heavy lifting, keep steri strips on x 72 hours, Keep pressure dressing on x 24 hours. Follow up prn problems.  

## 2019-07-10 ENCOUNTER — Other Ambulatory Visit: Payer: PRIVATE HEALTH INSURANCE | Admitting: Adult Health

## 2019-09-03 ENCOUNTER — Encounter: Payer: Self-pay | Admitting: Adult Health

## 2019-09-03 ENCOUNTER — Ambulatory Visit (INDEPENDENT_AMBULATORY_CARE_PROVIDER_SITE_OTHER): Payer: BC Managed Care – PPO | Admitting: Adult Health

## 2019-09-03 ENCOUNTER — Other Ambulatory Visit (HOSPITAL_COMMUNITY)
Admission: RE | Admit: 2019-09-03 | Discharge: 2019-09-03 | Disposition: A | Discharge status: Home / Self Care | Payer: BC Managed Care – PPO | Source: Ambulatory Visit | Attending: Adult Health | Admitting: Adult Health

## 2019-09-03 ENCOUNTER — Other Ambulatory Visit: Payer: Self-pay

## 2019-09-03 VITALS — BP 140/93 | HR 92 | Ht 64.5 in | Wt 223.6 lb

## 2019-09-03 DIAGNOSIS — Z01419 Encounter for gynecological examination (general) (routine) without abnormal findings: Secondary | ICD-10-CM | POA: Insufficient documentation

## 2019-09-03 DIAGNOSIS — Z1212 Encounter for screening for malignant neoplasm of rectum: Secondary | ICD-10-CM | POA: Diagnosis not present

## 2019-09-03 DIAGNOSIS — Z1211 Encounter for screening for malignant neoplasm of colon: Secondary | ICD-10-CM | POA: Diagnosis not present

## 2019-09-03 DIAGNOSIS — R1032 Left lower quadrant pain: Secondary | ICD-10-CM | POA: Insufficient documentation

## 2019-09-03 LAB — HEMOCCULT GUIAC POC 1CARD (OFFICE): Fecal Occult Blood, POC: NEGATIVE

## 2019-09-03 NOTE — Progress Notes (Signed)
Patient ID: Regina Fisher, female   DOB: September 02, 1975, 44 y.o.   MRN: [id number] History of Present Illness:  Regina Fisher is a 44 year old white female, married, G0P0, had nexplanon removed in November, using condomd husband to get vasectomy 09/21/19. Period started Thursday, had pain Saturday LLQ, kind sharp.  PCP is Dr Gerarda Fraction.   Current Medications, Allergies, Past Medical History, Past Surgical History, Family History and Social History were reviewed in [facility] record.     Review of Systems: Patient denies any headaches, hearing loss, fatigue, blurred vision, shortness of breath, chest pain, problems with bowel movements, urination, or intercourse. No joint pain or mood swings. See HPI for positives.  Physical Exam:BP (!) 140/93 (BP Location: Left Arm, Patient Position: Sitting, Cuff Size: Normal)   Pulse 92   Ht 5' 4.5" (1.638 m)   Wt 223 lb 9.6 oz (101.4 kg)   LMP 08/30/2019 (Exact Date)   BMI 37.79 kg/m  General:  Well developed, well nourished, no acute distress Skin:  Warm and dry Neck:  Midline trachea, normal thyroid, good ROM, no lymphadenopathy Lungs; Clear to auscultation bilaterally Breast:  No dominant palpable mass, retraction, or nipple discharge Cardiovascular: Regular rate and rhythm Abdomen:  Soft, non tender, no hepatosplenomegaly Pelvic:  External genitalia is normal in appearance, no lesions.  The vagina is normal in appearance,+brown blood. Urethra has no lesions or masses. The cervix is bulbous, no CMT,pap with high risk HPV 16/18 genotyping performed.  Uterus is felt to be normal size, shape, and contour,mildly tender.  No adnexal masses,LLQ tenderness noted.Bladder is non tender, no masses felt. Rectal: Good sphincter tone, no polyps, or hemorrhoids felt.  Hemoccult negative. Extremities/musculoskeletal:  No swelling or varicosities noted, no clubbing or cyanosis Psych:  No mood changes, alert and cooperative,seems happy Fall risk is low PHQ  2 score is 0. Examination chaperoned by Rolena Infante LPN  Impression and Plan: 1. Encounter for gynecological examination with Papanicolaou smear of cervix Pap sent Physical in 1 year Pap in 3 if normal Labs with PCP   2. Screening for colorectal cancer  3. LLQ pain Will get [place] to assess pain in about a week

## 2019-09-05 LAB — CYTOLOGY - PAP
Comment: NEGATIVE
Diagnosis: NEGATIVE
High risk HPV: NEGATIVE

## 2019-09-11 ENCOUNTER — Ambulatory Visit (INDEPENDENT_AMBULATORY_CARE_PROVIDER_SITE_OTHER): Payer: BC Managed Care – PPO

## 2019-09-11 ENCOUNTER — Other Ambulatory Visit: Payer: Self-pay

## 2019-09-11 DIAGNOSIS — R1032 Left lower quadrant pain: Secondary | ICD-10-CM

## 2019-09-11 NOTE — Progress Notes (Signed)
PELVIC US TA/TV: homogeneous anteverted uterus,wnl,EEC 6.3 mm,mult small simple nabothian cyst,normal left ovary,simple right dominate follicle 2.1 x 1.5 x 1.8 cm,no free fluid,no pain during ultrasound

## 2019-09-17 ENCOUNTER — Other Ambulatory Visit: Payer: Self-pay

## 2019-09-17 ENCOUNTER — Other Ambulatory Visit (HOSPITAL_COMMUNITY): Payer: Self-pay | Admitting: Family Medicine

## 2019-09-17 ENCOUNTER — Ambulatory Visit (HOSPITAL_COMMUNITY)
Admission: RE | Admit: 2019-09-17 | Discharge: 2019-09-17 | Disposition: A | Discharge status: Home / Self Care | Payer: BC Managed Care – PPO | Source: Ambulatory Visit | Attending: Family Medicine | Admitting: Family Medicine

## 2019-09-17 DIAGNOSIS — M25552 Pain in left hip: Secondary | ICD-10-CM

## 2019-09-19 ENCOUNTER — Other Ambulatory Visit (HOSPITAL_COMMUNITY): Payer: Self-pay | Admitting: Family Medicine

## 2019-09-19 DIAGNOSIS — Z1231 Encounter for screening mammogram for malignant neoplasm of breast: Secondary | ICD-10-CM

## 2019-10-01 ENCOUNTER — Ambulatory Visit (HOSPITAL_COMMUNITY)
Admission: RE | Admit: 2019-10-01 | Discharge: 2019-10-01 | Disposition: A | Discharge status: Home / Self Care | Payer: BC Managed Care – PPO | Source: Ambulatory Visit | Attending: Family Medicine | Admitting: Family Medicine

## 2019-10-01 ENCOUNTER — Other Ambulatory Visit (HOSPITAL_COMMUNITY): Payer: Self-pay | Admitting: Family Medicine

## 2019-10-01 ENCOUNTER — Other Ambulatory Visit: Payer: Self-pay

## 2019-10-01 DIAGNOSIS — Z1231 Encounter for screening mammogram for malignant neoplasm of breast: Secondary | ICD-10-CM | POA: Insufficient documentation

## 2019-10-01 DIAGNOSIS — E2839 Other primary ovarian failure: Secondary | ICD-10-CM

## 2019-10-04 ENCOUNTER — Inpatient Hospital Stay
Admission: RE | Admit: 2019-10-04 | Discharge: 2019-10-04 | Disposition: A | Discharge status: Home / Self Care | Payer: Self-pay | Source: Ambulatory Visit | Attending: Family Medicine | Admitting: Family Medicine

## 2019-10-04 ENCOUNTER — Other Ambulatory Visit (HOSPITAL_COMMUNITY): Payer: Self-pay | Admitting: Family Medicine

## 2019-10-04 DIAGNOSIS — Z1231 Encounter for screening mammogram for malignant neoplasm of breast: Secondary | ICD-10-CM

## 2019-10-08 ENCOUNTER — Other Ambulatory Visit (HOSPITAL_COMMUNITY): Payer: Self-pay | Admitting: Family Medicine

## 2019-10-08 DIAGNOSIS — R928 Other abnormal and inconclusive findings on diagnostic imaging of breast: Secondary | ICD-10-CM

## 2019-10-11 ENCOUNTER — Ambulatory Visit (HOSPITAL_COMMUNITY)
Admission: RE | Admit: 2019-10-11 | Discharge: 2019-10-11 | Disposition: A | Discharge status: Home / Self Care | Payer: BC Managed Care – PPO | Source: Ambulatory Visit | Attending: Family Medicine | Admitting: Family Medicine

## 2019-10-11 ENCOUNTER — Other Ambulatory Visit: Payer: Self-pay

## 2019-10-11 DIAGNOSIS — E2839 Other primary ovarian failure: Secondary | ICD-10-CM | POA: Diagnosis present

## 2019-10-23 ENCOUNTER — Ambulatory Visit (HOSPITAL_COMMUNITY)
Admission: RE | Admit: 2019-10-23 | Discharge: 2019-10-23 | Disposition: A | Discharge status: Home / Self Care | Payer: BC Managed Care – PPO | Source: Ambulatory Visit | Attending: Family Medicine | Admitting: Family Medicine

## 2019-10-23 ENCOUNTER — Other Ambulatory Visit: Payer: Self-pay

## 2019-10-23 DIAGNOSIS — R928 Other abnormal and inconclusive findings on diagnostic imaging of breast: Secondary | ICD-10-CM

## 2020-06-17 ENCOUNTER — Other Ambulatory Visit (HOSPITAL_COMMUNITY): Payer: Self-pay | Admitting: Family Medicine

## 2020-06-17 DIAGNOSIS — R928 Other abnormal and inconclusive findings on diagnostic imaging of breast: Secondary | ICD-10-CM

## 2020-06-17 DIAGNOSIS — N63 Unspecified lump in unspecified breast: Secondary | ICD-10-CM

## 2020-07-15 ENCOUNTER — Ambulatory Visit (HOSPITAL_COMMUNITY)
Admission: RE | Admit: 2020-07-15 | Discharge: 2020-07-15 | Disposition: A | Discharge status: Home / Self Care | Payer: BC Managed Care – PPO | Source: Ambulatory Visit | Attending: Family Medicine | Admitting: Family Medicine

## 2020-07-15 ENCOUNTER — Other Ambulatory Visit: Payer: Self-pay

## 2020-07-15 DIAGNOSIS — R928 Other abnormal and inconclusive findings on diagnostic imaging of breast: Secondary | ICD-10-CM | POA: Diagnosis present

## 2020-07-15 DIAGNOSIS — N63 Unspecified lump in unspecified breast: Secondary | ICD-10-CM | POA: Diagnosis not present

## 2020-09-04 ENCOUNTER — Other Ambulatory Visit: Payer: BC Managed Care – PPO | Admitting: Adult Health

## 2020-10-14 ENCOUNTER — Ambulatory Visit (INDEPENDENT_AMBULATORY_CARE_PROVIDER_SITE_OTHER): Payer: BC Managed Care – PPO | Admitting: Adult Health

## 2020-10-14 ENCOUNTER — Encounter: Payer: Self-pay | Admitting: Adult Health

## 2020-10-14 ENCOUNTER — Other Ambulatory Visit: Payer: Self-pay

## 2020-10-14 VITALS — BP 129/83 | HR 78 | Ht 64.25 in | Wt 227.5 lb

## 2020-10-14 DIAGNOSIS — Z1211 Encounter for screening for malignant neoplasm of colon: Secondary | ICD-10-CM

## 2020-10-14 DIAGNOSIS — Z01419 Encounter for gynecological examination (general) (routine) without abnormal findings: Secondary | ICD-10-CM | POA: Diagnosis not present

## 2020-10-14 LAB — HEMOCCULT GUIAC POC 1CARD (OFFICE): Fecal Occult Blood, POC: NEGATIVE

## 2020-10-14 NOTE — Progress Notes (Signed)
Patient ID: Regina Fisher, female   DOB: 03/12/1976, 45 y.o.   MRN: [id number] History of Present Illness: Regina Fisher is 45 year old white female,married, G0P0, in for a well woman gyn exam, she had normal pap with negative HPV 09/03/19. PCP is Dr Gerarda Fraction.    Current Medications, Allergies, Past Medical History, Past Surgical History, Family History and Social History were reviewed in [facility] record.     Review of Systems: Patient denies any headaches, hearing loss, fatigue, blurred vision, shortness of breath, chest pain, abdominal pain, problems with bowel movements, urination, or intercourse. No joint pain or mood swings. Periods a little irregular,not heavy and no pain She had COVID in August and antibodies are high    Physical Exam:BP 129/83 (BP Location: Left Arm, Patient Position: Sitting, Cuff Size: Large)   Pulse 78   Ht 5' 4.25" (1.632 m)   Wt 227 lb 8 oz (103.2 kg)   LMP 09/20/2020   BMI 38.75 kg/m  General:  Well developed, well nourished, no acute distress Skin:  Warm and dry Neck:  Midline trachea, normal thyroid, good ROM, no lymphadenopathy Lungs; Clear to auscultation bilaterally Breast:  No dominant palpable mass, retraction, or nipple discharge Cardiovascular: Regular rate and rhythm Abdomen:  Soft, non tender, no hepatosplenomegaly Pelvic:  External genitalia is normal in appearance, no lesions.  The vagina is normal in appearance. Urethra has no lesions or masses. The cervix is smooth.  Uterus is felt to be normal size, shape, and contour.  No adnexal masses or tenderness noted.Bladder is non tender, no masses felt. Rectal: Good sphincter tone, no polyps, or hemorrhoids felt.  Hemoccult negative. Extremities/musculoskeletal:  No swelling or varicosities noted, no clubbing or cyanosis Psych:  No mood changes, alert and cooperative,seems happy AA is 1 Fall risk is low PHQ 9 score is 3 GAD 7 score is 5  [facility] - 10/14/20 1425      Pregnancy  Intention Screening   Does the patient want to become pregnant in the next year? No    Does the patient's partner want to become pregnant in the next year? No    Would the patient like to discuss contraceptive options today? No      Contraception Wrap Up   Current Method Vasectomy    End Method Vasectomy    Contraception Counseling Provided No         Co exam with Tinnie Gens NP student  Impression and Plan: 1. Encounter for well woman exam with routine gynecological exam Physical in 1 year  Pap 2024 Labs with PCP Mammogram in April Colonoscopy at 61  2. Encounter for screening fecal occult blood testing

## 2020-11-06 ENCOUNTER — Other Ambulatory Visit (HOSPITAL_COMMUNITY): Payer: Self-pay | Admitting: Internal Medicine

## 2020-11-06 DIAGNOSIS — N63 Unspecified lump in unspecified breast: Secondary | ICD-10-CM

## 2020-12-02 ENCOUNTER — Ambulatory Visit (HOSPITAL_COMMUNITY): Payer: BC Managed Care – PPO

## 2020-12-02 ENCOUNTER — Encounter (HOSPITAL_COMMUNITY): Payer: BC Managed Care – PPO

## 2020-12-24 ENCOUNTER — Ambulatory Visit: Payer: BC Managed Care – PPO | Admitting: Adult Health

## 2021-01-02 ENCOUNTER — Other Ambulatory Visit: Payer: Self-pay

## 2021-01-02 ENCOUNTER — Ambulatory Visit: Payer: BC Managed Care – PPO | Admitting: Adult Health

## 2021-01-06 ENCOUNTER — Ambulatory Visit (HOSPITAL_COMMUNITY)
Admission: RE | Admit: 2021-01-06 | Discharge: 2021-01-06 | Disposition: A | Discharge status: Home / Self Care | Payer: BC Managed Care – PPO | Source: Ambulatory Visit | Attending: Internal Medicine | Admitting: Internal Medicine

## 2021-01-06 ENCOUNTER — Encounter (HOSPITAL_COMMUNITY): Payer: Self-pay

## 2021-01-06 DIAGNOSIS — N63 Unspecified lump in unspecified breast: Secondary | ICD-10-CM

## 2021-04-08 HISTORY — PX: ROTATOR CUFF REPAIR: SHX139

## 2021-09-08 ENCOUNTER — Telehealth: Payer: BC Managed Care – PPO | Admitting: Physician Assistant

## 2021-09-08 DIAGNOSIS — J208 Acute bronchitis due to other specified organisms: Secondary | ICD-10-CM | POA: Diagnosis not present

## 2021-09-08 MED ORDER — PSEUDOEPH-BROMPHEN-DM 30-2-10 MG/5ML PO SYRP: 5.0000 mL | ORAL_SOLUTION | Freq: Four times a day (QID) | ORAL | 0 refills | Status: DC | PRN

## 2021-09-08 MED ORDER — PREDNISONE 20 MG PO TABS: 40.0000 mg | ORAL_TABLET | Freq: Every day | ORAL | 0 refills | Status: DC

## 2021-09-08 MED ORDER — BENZONATATE 100 MG PO CAPS: 100.0000 mg | ORAL_CAPSULE | Freq: Three times a day (TID) | ORAL | 0 refills | Status: DC | PRN

## 2021-09-08 MED ORDER — ALBUTEROL SULFATE HFA 108 (90 BASE) MCG/ACT IN AERS: 2.0000 | INHALATION_SPRAY | Freq: Four times a day (QID) | RESPIRATORY_TRACT | 0 refills | Status: DC | PRN

## 2021-09-08 NOTE — Patient Instructions (Signed)
Regina Fisher, thank you for joining Mar Daring, PA-C for today's virtual visit.  While this provider is not your primary care provider (PCP), if your PCP is located in our provider database this encounter information will be shared with them immediately following your visit.  Consent: (Patient) Regina Fisher provided verbal consent for this virtual visit at the beginning of the encounter.  Current Medications:  Current Outpatient Medications:    albuterol (VENTOLIN HFA) 108 (90 Base) MCG/ACT inhaler, Inhale 2 puffs into the lungs every 6 (six) hours as needed for wheezing or shortness of breath., Disp: 8 g, Rfl: 0   benzonatate (TESSALON) 100 MG capsule, Take 1 capsule (100 mg total) by mouth 3 (three) times daily as needed., Disp: 30 capsule, Rfl: 0   brompheniramine-pseudoephedrine-DM 30-2-10 MG/5ML syrup, Take 5 mLs by mouth 4 (four) times daily as needed., Disp: 120 mL, Rfl: 0   predniSONE (DELTASONE) 20 MG tablet, Take 2 tablets (40 mg total) by mouth daily with breakfast., Disp: 10 tablet, Rfl: 0   acetaminophen (TYLENOL) 325 MG tablet, Take 2 tablets (650 mg total) by mouth every 6 (six) hours as needed for mild pain, fever or headache (or Fever >/= 101)., Disp: 15 tablet, Rfl: 0   BLACK ELDERBERRY,BERRY-FLOWER, PO, Take 1 capsule by mouth daily. , Disp: , Rfl:    Cholecalciferol (VITAMIN D3) 1.25 MG (50000 UT) CAPS, Take by mouth., Disp: , Rfl:    UNABLE TO FIND, Doterra products, Disp: , Rfl:    Medications ordered in this encounter:  Meds ordered this encounter  Medications   predniSONE (DELTASONE) 20 MG tablet    Sig: Take 2 tablets (40 mg total) by mouth daily with breakfast.    Dispense:  10 tablet    Refill:  0    Order Specific Question:   Supervising Provider    Answer:   Sabra Heck, BRIAN [[id number]]   albuterol (VENTOLIN HFA) 108 (90 Base) MCG/ACT inhaler    Sig: Inhale 2 puffs into the lungs every 6 (six) hours as needed for wheezing or shortness of breath.     Dispense:  8 g    Refill:  0    Order Specific Question:   Supervising Provider    Answer:   MILLER, BRIAN [[id number]]   brompheniramine-pseudoephedrine-DM 30-2-10 MG/5ML syrup    Sig: Take 5 mLs by mouth 4 (four) times daily as needed.    Dispense:  120 mL    Refill:  0    Order Specific Question:   Supervising Provider    Answer:   MILLER, BRIAN [[id number]]   benzonatate (TESSALON) 100 MG capsule    Sig: Take 1 capsule (100 mg total) by mouth 3 (three) times daily as needed.    Dispense:  30 capsule    Refill:  0    Order Specific Question:   Supervising Provider    Answer:   Sabra Heck, [place]     *If you need refills on other medications prior to your next appointment, please contact your pharmacy*  Follow-Up: Call back or seek an in-person evaluation if the symptoms worsen or if the condition fails to improve as anticipated.  Other Instructions Acute Bronchitis, Adult Acute bronchitis is sudden inflammation of the main airways (bronchi) that come off the windpipe (trachea) in the lungs. The swelling causes the airways to get smaller and make more mucus than normal. This can make it hard to breathe and can cause coughing or noisy breathing (wheezing). Acute bronchitis may last several  weeks. The cough may last longer. Allergies, asthma, and exposure to smoke may make the condition worse. What are the causes? This condition can be caused by germs and by substances that irritate the lungs, including: Cold and flu viruses. The most common cause of this condition is the virus that causes the common cold. Bacteria. This is less common. Breathing in substances that irritate the lungs, including: Smoke from cigarettes and other forms of tobacco. Dust and pollen. Fumes from household cleaning products, gases, or burned fuel. Indoor or outdoor air pollution. What increases the risk? The following factors may make you more likely to develop this condition: A weak body's defense system, also  called the immune system. A condition that affects your lungs and breathing, such as asthma. What are the signs or symptoms? Common symptoms of this condition include: Coughing. This may bring up clear, yellow, or green mucus from your lungs (sputum). Wheezing. Runny or stuffy nose. Having too much mucus in your lungs (chest congestion). Shortness of breath. Aches and pains, including sore throat or chest. How is this diagnosed? This condition is usually diagnosed based on: Your symptoms and medical history. A physical exam. You may also have other tests, including tests to rule out other conditions, such as pneumonia. These tests include: A test of lung function. Test of a mucus sample to look for the presence of bacteria. Tests to check the oxygen level in your blood. Blood tests. Chest X-ray. How is this treated? Most cases of acute bronchitis clear up over time without treatment. Your health care provider may recommend: Drinking more fluids to help thin your mucus so it is easier to cough up. Taking inhaled medicine (inhaler) to improve air flow in and out of your lungs. Using a vaporizer or a humidifier. These are machines that add water to the air to help you breathe better. Taking a medicine that thins mucus and clears congestion (expectorant). Taking a medicine that prevents or stops coughing (cough suppressant). It is notcommon to take an antibiotic medicine for this condition. Follow these instructions at home:  Take over-the-counter and prescription medicines only as told by your health care provider. Use an inhaler, vaporizer, or humidifier as told by your health care provider. Take two teaspoons (10 mL) of honey at bedtime to lessen coughing at night. Drink enough fluid to keep your urine pale yellow. Do not use any products that contain nicotine or tobacco. These products include cigarettes, chewing tobacco, and vaping devices, such as e-cigarettes. If you need help  quitting, ask your health care provider. Get plenty of rest. Return to your normal activities as told by your health care provider. Ask your health care provider what activities are safe for you. Keep all follow-up visits. This is important. How is this prevented? To lower your risk of getting this condition again: Wash your hands often with soap and water for at least 20 seconds. If soap and water are not available, use hand sanitizer. Avoid contact with people who have cold symptoms. Try not to touch your mouth, nose, or eyes with your hands. Avoid breathing in smoke or chemical fumes. Breathing smoke or chemical fumes will make your condition worse. Get the flu shot every year. Contact a health care provider if: Your symptoms do not improve after 2 weeks. You have trouble coughing up the mucus. Your cough keeps you awake at night. You have a fever. Get help right away if you: Cough up blood. Feel pain in your chest. Have severe  shortness of breath. Faint or keep feeling like you are going to faint. Have a severe headache. Have a fever or chills that get worse. These symptoms may represent a serious problem that is an emergency. Do not wait to see if the symptoms will go away. Get medical help right away. Call your local emergency services (911 in the [place] Do not drive yourself to the hospital. Summary Acute bronchitis is inflammation of the main airways (bronchi) that come off the windpipe (trachea) in the lungs. The swelling causes the airways to get smaller and make more mucus than normal. Drinking more fluids can help thin your mucus so it is easier to cough up. Take over-the-counter and prescription medicines only as told by your health care provider. Do not use any products that contain nicotine or tobacco. These products include cigarettes, chewing tobacco, and vaping devices, such as e-cigarettes. If you need help quitting, ask your health care provider. Contact a health care  provider if your symptoms do not improve after 2 weeks. This information is not intended to replace advice given to you by your health care provider. Make sure you discuss any questions you have with your health care provider. Document Revised: 11/05/2020 Document Reviewed: 11/05/2020 Elsevier Patient Education  2022 [employer].    If you have been instructed to have an in-person evaluation today at a local Urgent Care facility, please use the link below. It will take you to a list of all of our available [facility], including address, phone number and hours of operation. Please do not delay care.  [facility]  If you or a family member do not have a primary care provider, use the link below to schedule a visit and establish care. When you choose a [facility] primary care physician or advanced practice provider, you gain a long-term partner in health. Find a Primary Care Provider  Learn more about [facility] in-office and virtual care options: [facility]

## 2021-09-08 NOTE — Progress Notes (Signed)
Virtual Visit Consent   Uva Runkel, you are scheduled for a virtual visit with a [facility] provider today.     Just as with appointments in the office, your consent must be obtained to participate.  Your consent will be active for this visit and any virtual visit you may have with one of our providers in the next 365 days.     If you have a MyChart account, a copy of this consent can be sent to you electronically.  All virtual visits are billed to your insurance company just like a traditional visit in the office.    As this is a virtual visit, video technology does not allow for your provider to perform a traditional examination.  This may limit your provider's ability to fully assess your condition.  If your provider identifies any concerns that need to be evaluated in person or the need to arrange testing (such as labs, EKG, etc.), we will make arrangements to do so.     Although advances in technology are sophisticated, we cannot ensure that it will always work on either your end or our end.  If the connection with a video visit is poor, the visit may have to be switched to a telephone visit.  With either a video or telephone visit, we are not always able to ensure that we have a secure connection.     I need to obtain your verbal consent now.   Are you willing to proceed with your visit today?    Chrys Landgrebe has provided verbal consent on 46 for a virtual visit (video or telephone).   Mar Daring, PA-C   Date: 09/08/2021 7:43 AM   Virtual Visit via Video Note   I, Mar Daring, connected with  Regina Fisher  ([id number], 46) on 09/08/21 at  7:30 AM EST by a video-enabled telemedicine application and verified that I am speaking with the correct person using two identifiers.  Location: Patient: Virtual Visit Location Patient: Home Provider: Virtual Visit Location Provider: Home Office   I discussed the limitations of evaluation and management by  telemedicine and the availability of in person appointments. The patient expressed understanding and agreed to proceed.    History of Present Illness: Regina Fisher is a 46 y.o. who identifies as a female who was assigned female at birth, and is being seen today for cough.  HPI: Cough This is a new problem. The current episode started in the past 7 days (Friday). The problem has been gradually worsening. The problem occurs every few minutes. The cough is Productive of sputum. Associated symptoms include headaches. Pertinent negatives include no chills, fever, nasal congestion, postnasal drip, rhinorrhea, sore throat or wheezing. The symptoms are aggravated by lying down. Treatments tried: robitussin, cough drops, tylenol. The treatment provided no relief. Her past medical history is significant for bronchitis. There is no history of asthma or pneumonia.     Problems:  Patient Active Problem List   Diagnosis Date Noted   Encounter for screening fecal occult blood testing 10/14/2020   Encounter for gynecological examination with Papanicolaou smear of cervix 09/03/2019   LLQ pain 09/03/2019   Encounter for Nexplanon removal 05/21/2019   Acute cholecystitis 09/21/2018   Cholelithiasis 09/21/2018   Screening for colorectal cancer 09/07/2018   Encounter for well woman exam with routine gynecological exam 09/07/2018    Allergies: No Known Allergies Medications:  Current Outpatient Medications:    albuterol (VENTOLIN HFA) 108 (90 Base) MCG/ACT inhaler, Inhale 2  puffs into the lungs every 6 (six) hours as needed for wheezing or shortness of breath., Disp: 8 g, Rfl: 0   benzonatate (TESSALON) 100 MG capsule, Take 1 capsule (100 mg total) by mouth 3 (three) times daily as needed., Disp: 30 capsule, Rfl: 0   brompheniramine-pseudoephedrine-DM 30-2-10 MG/5ML syrup, Take 5 mLs by mouth 4 (four) times daily as needed., Disp: 120 mL, Rfl: 0   predniSONE (DELTASONE) 20 MG tablet, Take 2 tablets (40 mg  total) by mouth daily with breakfast., Disp: 10 tablet, Rfl: 0   acetaminophen (TYLENOL) 325 MG tablet, Take 2 tablets (650 mg total) by mouth every 6 (six) hours as needed for mild pain, fever or headache (or Fever >/= 101)., Disp: 15 tablet, Rfl: 0   BLACK ELDERBERRY,BERRY-FLOWER, PO, Take 1 capsule by mouth daily. , Disp: , Rfl:    Cholecalciferol (VITAMIN D3) 1.25 MG (50000 UT) CAPS, Take by mouth., Disp: , Rfl:    UNABLE TO FIND, Doterra products, Disp: , Rfl:   Observations/Objective: Patient is well-developed, well-nourished in no acute distress.  Resting comfortably at home.  Head is normocephalic, atraumatic.  No labored breathing.  Speech is clear and coherent with logical content.  Patient is alert and oriented at baseline.    Assessment and Plan: 1. Viral bronchitis - predniSONE (DELTASONE) 20 MG tablet; Take 2 tablets (40 mg total) by mouth daily with breakfast.  Dispense: 10 tablet; Refill: 0 - albuterol (VENTOLIN HFA) 108 (90 Base) MCG/ACT inhaler; Inhale 2 puffs into the lungs every 6 (six) hours as needed for wheezing or shortness of breath.  Dispense: 8 g; Refill: 0 - brompheniramine-pseudoephedrine-DM 30-2-10 MG/5ML syrup; Take 5 mLs by mouth 4 (four) times daily as needed.  Dispense: 120 mL; Refill: 0 - benzonatate (TESSALON) 100 MG capsule; Take 1 capsule (100 mg total) by mouth 3 (three) times daily as needed.  Dispense: 30 capsule; Refill: 0  - Suspect bronchitis - Prednisone and albuterol prescribed - Tessalon perles and Bromfed DM for cough - Steam and humidifier can help - Push fluids - Rest - Seek in person evaluation if symptoms worsen or fail to improve  Follow Up Instructions: I discussed the assessment and treatment plan with the patient. The patient was provided an opportunity to ask questions and all were answered. The patient agreed with the plan and demonstrated an understanding of the instructions.  A copy of instructions were sent to the patient via  MyChart unless otherwise noted below.   The patient was advised to call back or seek an in-person evaluation if the symptoms worsen or if the condition fails to improve as anticipated.  Time:  I spent 11 minutes with the patient via telehealth technology discussing the above problems/concerns.    Mar Daring, PA-C

## 2021-09-18 ENCOUNTER — Other Ambulatory Visit: Payer: Self-pay

## 2021-09-18 ENCOUNTER — Ambulatory Visit
Admission: RE | Admit: 2021-09-18 | Discharge: 2021-09-18 | Disposition: A | Discharge status: Home / Self Care | Payer: BC Managed Care – PPO | Source: Ambulatory Visit | Attending: Urgent Care | Admitting: Urgent Care

## 2021-09-18 ENCOUNTER — Ambulatory Visit (INDEPENDENT_AMBULATORY_CARE_PROVIDER_SITE_OTHER): Payer: BC Managed Care – PPO

## 2021-09-18 VITALS — BP 153/101 | HR 98 | Temp 98.6°F | Resp 18 | Ht 64.0 in | Wt 220.0 lb

## 2021-09-18 DIAGNOSIS — J069 Acute upper respiratory infection, unspecified: Secondary | ICD-10-CM | POA: Diagnosis not present

## 2021-09-18 DIAGNOSIS — R053 Chronic cough: Secondary | ICD-10-CM

## 2021-09-18 DIAGNOSIS — R0982 Postnasal drip: Secondary | ICD-10-CM

## 2021-09-18 DIAGNOSIS — R052 Subacute cough: Secondary | ICD-10-CM | POA: Diagnosis not present

## 2021-09-18 DIAGNOSIS — R059 Cough, unspecified: Secondary | ICD-10-CM | POA: Diagnosis not present

## 2021-09-18 MED ORDER — PROMETHAZINE-DM 6.25-15 MG/5ML PO SYRP: 5.0000 mL | ORAL_SOLUTION | Freq: Every evening | ORAL | 0 refills | Status: DC | PRN

## 2021-09-18 MED ORDER — LEVOCETIRIZINE DIHYDROCHLORIDE 5 MG PO TABS
5.0000 mg | ORAL_TABLET | Freq: Every evening | ORAL | 0 refills | Status: DC
Start: 2021-09-18 — End: 2021-10-15

## 2021-09-18 MED ORDER — PSEUDOEPHEDRINE HCL 60 MG PO TABS: 60.0000 mg | ORAL_TABLET | Freq: Three times a day (TID) | ORAL | 0 refills | Status: DC | PRN

## 2021-09-18 NOTE — ED Triage Notes (Signed)
Pt reports cough for last several weeks. Pt reports was seen for same and prescribed prednisone and cough syrup and reports cough still persists, worse at night and states "I'm tired of not being able to sleep." ?

## 2021-09-18 NOTE — ED Provider Notes (Signed)
?[facility] [facility] ? ? ?MRN: [id number] DOB: Sep 14, 1975 ? ?Subjective:  ? ?Regina Fisher is a 46 y.o. female presenting for several week history of persistent coughing that is worse at night.  Patient has already undergone a course of prednisone and finished the about a week ago.  Denies fever, sinus pain, throat pain, chest pain, shortness of breath or wheezing.  No history of respiratory disorders.  Patient is not a smoker. ? ?No current facility-administered medications for this encounter. ? ?Current Outpatient Medications:  ?  acetaminophen (TYLENOL) 325 MG tablet, Take 2 tablets (650 mg total) by mouth every 6 (six) hours as needed for mild pain, fever or headache (or Fever >/= 101)., Disp: 15 tablet, Rfl: 0 ?  albuterol (VENTOLIN HFA) 108 (90 Base) MCG/ACT inhaler, Inhale 2 puffs into the lungs every 6 (six) hours as needed for wheezing or shortness of breath., Disp: 8 g, Rfl: 0 ?  benzonatate (TESSALON) 100 MG capsule, Take 1 capsule (100 mg total) by mouth 3 (three) times daily as needed., Disp: 30 capsule, Rfl: 0 ?  BLACK ELDERBERRY,BERRY-FLOWER, PO, Take 1 capsule by mouth daily. , Disp: , Rfl:  ?  brompheniramine-pseudoephedrine-DM 30-2-10 MG/5ML syrup, Take 5 mLs by mouth 4 (four) times daily as needed., Disp: 120 mL, Rfl: 0 ?  Cholecalciferol (VITAMIN D3) 1.25 MG (50000 UT) CAPS, Take by mouth., Disp: , Rfl:  ?  predniSONE (DELTASONE) 20 MG tablet, Take 2 tablets (40 mg total) by mouth daily with breakfast., Disp: 10 tablet, Rfl: 0 ?  UNABLE TO FIND, Doterra products, Disp: , Rfl:   ? ?No Known Allergies ? ?Past Medical History:  ?Diagnosis Date  ? Tumor of optic nerve   ?  ? ?Past Surgical History:  ?Procedure Laterality Date  ? bone spur    ? CHOLECYSTECTOMY N/A 09/27/2018  ? Procedure: LAPAROSCOPIC CHOLECYSTECTOMY;  Surgeon: Aviva Signs, MD;  Location: [facility];  Service: General;  Laterality: N/A;  ? fatty turmor removal back    ? gamma knife 2018    ? ganna knight    ? TENDON REPAIR     ? ? ?Family History  ?Problem Relation Age of Onset  ? Osteoporosis Maternal Grandmother   ? Hypertension Mother   ? Mental illness Brother   ? ? ?Social History  ? ?Tobacco Use  ? Smoking status: Never  ? Smokeless tobacco: Never  ?Vaping Use  ? Vaping Use: Never used  ?Substance Use Topics  ? Alcohol use: Yes  ?  Comment: occ  ? Drug use: Never  ? ? ?ROS ? ? ?Objective:  ? ?Vitals: ?BP (!) 153/101 Comment: x3 attempts. pt reports "its always high when i come to the doctor, i get nervous."  Pulse 98   Temp 98.6 ?F (37 ?C) (Oral)   Resp 18   Ht 5\' 4"  (1.626 m)   Wt 220 lb (99.8 kg)   LMP 08/31/2021 (Approximate)   SpO2 98%   BMI 37.76 kg/m?  ? ?Physical Exam ?Constitutional:   ?   General: She is not in acute distress. ?   Appearance: Normal appearance. She is well-developed. She is not ill-appearing, toxic-appearing or diaphoretic.  ?HENT:  ?   Head: Normocephalic and atraumatic.  ?   Nose: Nose normal.  ?   Mouth/Throat:  ?   Mouth: Mucous membranes are moist.  ?   Pharynx: No oropharyngeal exudate or posterior oropharyngeal erythema.  ?Eyes:  ?   General: No scleral icterus.    ?  Right eye: No discharge.     ?   Left eye: No discharge.  ?   Extraocular Movements: Extraocular movements intact.  ?Cardiovascular:  ?   Rate and Rhythm: Normal rate.  ?   Heart sounds: No murmur heard. ?  No friction rub. No gallop.  ?Pulmonary:  ?   Effort: Pulmonary effort is normal. No respiratory distress.  ?   Breath sounds: No stridor. No wheezing, rhonchi or rales.  ?Chest:  ?   Chest wall: No tenderness.  ?Skin: ?   General: Skin is warm and dry.  ?Neurological:  ?   General: No focal deficit present.  ?   Mental Status: She is alert and oriented to person, place, and time.  ?Psychiatric:     ?   Mood and Affect: Mood normal.     ?   Behavior: Behavior normal.  ? ? ?DG Chest 2 View ? ?Result Date: 09/18/2021 ?CLINICAL DATA:  Persistent cough. EXAM: CHEST - 2 VIEW COMPARISON:  None. FINDINGS: The heart size and  mediastinal contours are within normal limits. Both lungs are clear. The visualized skeletal structures are unremarkable. IMPRESSION: No active cardiopulmonary disease. Electronically Signed   By: Kerby Moors M.D.   On: 09/18/2021 14:23   ? ?Assessment and Plan :  ? ?PDMP not reviewed this encounter. ? ?1. Viral upper respiratory infection   ?2. Post-nasal drainage   ?3. Subacute cough   ?4. Persistent cough   ? ?Given timeline of illness, will defer COVID and flu testing.  I do believe she would benefit from more steroids.  She also has no fever or pain and therefore will defer antibiotic use.  Recommended conservative management with Tylenol, ibuprofen, Xyzal and pseudoephedrine. Counseled patient on potential for adverse effects with medications prescribed/recommended today, ER and return-to-clinic precautions discussed, patient verbalized understanding. ? ?  ?Jaynee Eagles, PA-C ?09/18/21 1444 ? ?

## 2021-10-15 ENCOUNTER — Ambulatory Visit (INDEPENDENT_AMBULATORY_CARE_PROVIDER_SITE_OTHER): Payer: BC Managed Care – PPO | Admitting: Adult Health

## 2021-10-15 ENCOUNTER — Encounter: Payer: Self-pay | Admitting: Adult Health

## 2021-10-15 VITALS — BP 145/85 | HR 68 | Ht 64.5 in | Wt 226.4 lb

## 2021-10-15 DIAGNOSIS — Z01419 Encounter for gynecological examination (general) (routine) without abnormal findings: Secondary | ICD-10-CM

## 2021-10-15 DIAGNOSIS — Z0000 Encounter for general adult medical examination without abnormal findings: Secondary | ICD-10-CM | POA: Diagnosis not present

## 2021-10-15 DIAGNOSIS — Z1211 Encounter for screening for malignant neoplasm of colon: Secondary | ICD-10-CM

## 2021-10-15 LAB — HEMOCCULT GUIAC POC 1CARD (OFFICE): Fecal Occult Blood, POC: NEGATIVE

## 2021-10-15 NOTE — Progress Notes (Signed)
Patient ID: Regina Fisher, female   DOB: 04-Jul-1976, 46 y.o.   MRN: [id number] ?History of Present Illness: ?Regina Fisher is a 46 year old white female,married, G0P0, in for a well woman gyn exam. ?PCP is Dr Gerarda Fraction. ? ?Lab Results  ?Component Value Date  ? DIAGPAP  09/03/2019  ?  - Negative for intraepithelial lesion or malignancy (NILM)  ? [place] Negative 09/03/2019  ?  ?Current Medications, Allergies, Past Medical History, Past Surgical History, Family History and Social History were reviewed in [employer] record.   ? ? ?Review of Systems: ?Patient denies any headaches, hearing loss, fatigue, blurred vision, shortness of breath, chest pain, abdominal pain, problems with bowel movements, urination, or intercourse. No joint pain or mood swings.  ?Periods regular. ? ? ?Physical Exam:BP (!) 145/85 (BP Location: Right Arm, Patient Position: Sitting, Cuff Size: Large)   Pulse 68   Ht 5' 4.5" (1.638 m)   Wt 226 lb 6.4 oz (102.7 kg)   LMP 10/05/2021 (Approximate)   BMI 38.26 kg/m?   ?General:  Well developed, well nourished, no acute distress ?Skin:  Warm and dry ?Neck:  Midline trachea, normal thyroid, good ROM, no lymphadenopathy ?Lungs; Clear to auscultation bilaterally ?Breast:  No dominant palpable mass, retraction, or nipple discharge ?Cardiovascular: Regular rate and rhythm ?Abdomen:  Soft, non tender, no hepatosplenomegaly ?Pelvic:  External genitalia is normal in appearance, no lesions.  The vagina is normal in appearance. Urethra has no lesions or masses. The cervix is smooth.  Uterus is felt to be normal size, shape, and contour.  No adnexal masses or tenderness noted.Bladder is non tender, no masses felt. ?Rectal: Good sphincter tone, no polyps, or hemorrhoids felt.  Hemoccult negative. ?Extremities/musculoskeletal:  No swelling or varicosities noted, no clubbing or cyanosis ?Psych:  No mood changes, alert and cooperative,seems happy ?AA is 2 ?Fall risk is low ? ?  10/15/2021  ?  8:42 AM  10/14/2020  ?  2:26 PM 09/03/2019  ?  9:46 AM  ?Depression screen PHQ 2/9  ?Decreased Interest 0 0 0  ?Down, Depressed, Hopeless 0 0 0  ?PHQ - 2 Score 0 0 0  ?Altered sleeping 1 0   ?Tired, decreased energy 1 2   ?Change in appetite 0 0   ?Feeling bad or failure about yourself  0 0   ?Trouble concentrating 0 1   ?Moving slowly or fidgety/restless 0 0   ?Suicidal thoughts 0 0   ?PHQ-9 Score 2 3   ?  ? ?  10/15/2021  ?  8:43 AM 10/14/2020  ?  2:27 PM  ?GAD 7 : Generalized Anxiety Score  ?Nervous, Anxious, on Edge 0 2  ?Control/stop worrying 0 2  ?Worry too much - different things 0 1  ?Trouble relaxing 0 0  ?Restless 0 0  ?Easily annoyed or irritable 0 0  ?Afraid - awful might happen 0 0  ?Total GAD 7 Score 0 5  ? ? [facility] - 10/15/21 0842   ? ?  ? Pregnancy Intention Screening  ? Does the patient want to become pregnant in the next year? No   ? Does the patient's partner want to become pregnant in the next year? No   ? Would the patient like to discuss contraceptive options today? No   ?  ? Contraception Wrap Up  ? Current Method Vasectomy   ? End Method Vasectomy   ? Contraception Counseling Provided No   ? ?  ?  ? ?  ?  ?  Examination chaperoned by Glenard Haring RN ? ? ?Impression and Plan: ?1. Encounter for well woman exam with routine gynecological exam ?Pap and physical in 1 year ?Labs with PCP ?Mammogram yearly ?Colonoscopy or cologuard at 54 ?Encouraged weight loss,even 20 lbs will help BP ?Follow up with PCP, who is watching BP ?Decrease salt and sugars ? ?2. Encounter for screening fecal occult blood testing ? ? ? ? ?  ?  ?

## 2021-12-04 ENCOUNTER — Other Ambulatory Visit (HOSPITAL_COMMUNITY): Payer: Self-pay | Admitting: Family Medicine

## 2021-12-04 DIAGNOSIS — Z1231 Encounter for screening mammogram for malignant neoplasm of breast: Secondary | ICD-10-CM

## 2022-01-08 ENCOUNTER — Ambulatory Visit (HOSPITAL_COMMUNITY)
Admission: RE | Admit: 2022-01-08 | Discharge: 2022-01-08 | Disposition: A | Discharge status: Home / Self Care | Payer: BC Managed Care – PPO | Source: Ambulatory Visit | Attending: Family Medicine | Admitting: Family Medicine

## 2022-01-08 DIAGNOSIS — Z1231 Encounter for screening mammogram for malignant neoplasm of breast: Secondary | ICD-10-CM | POA: Insufficient documentation

## 2022-01-12 ENCOUNTER — Encounter: Payer: Self-pay | Admitting: Adult Health

## 2022-01-12 ENCOUNTER — Ambulatory Visit: Payer: BC Managed Care – PPO | Admitting: Adult Health

## 2022-01-12 VITALS — BP 160/100 | HR 83 | Ht 64.25 in | Wt 225.0 lb

## 2022-01-12 DIAGNOSIS — I10 Essential (primary) hypertension: Secondary | ICD-10-CM | POA: Insufficient documentation

## 2022-01-12 DIAGNOSIS — R1031 Right lower quadrant pain: Secondary | ICD-10-CM

## 2022-01-12 MED ORDER — HYDROCHLOROTHIAZIDE 12.5 MG PO CAPS: 12.5000 mg | ORAL_CAPSULE | Freq: Every day | ORAL | 6 refills | Status: DC

## 2022-01-22 ENCOUNTER — Ambulatory Visit (HOSPITAL_COMMUNITY): Payer: BC Managed Care – PPO

## 2022-01-29 ENCOUNTER — Ambulatory Visit (HOSPITAL_COMMUNITY)
Admission: RE | Admit: 2022-01-29 | Discharge: 2022-01-29 | Disposition: A | Discharge status: Home / Self Care | Payer: BC Managed Care – PPO | Source: Ambulatory Visit | Attending: Adult Health | Admitting: Adult Health

## 2022-01-29 DIAGNOSIS — R1031 Right lower quadrant pain: Secondary | ICD-10-CM | POA: Diagnosis present

## 2022-02-25 ENCOUNTER — Other Ambulatory Visit: Payer: Self-pay

## 2022-02-25 ENCOUNTER — Emergency Department (HOSPITAL_COMMUNITY)
Admission: EM | Admit: 2022-02-25 | Discharge: 2022-02-25 | Disposition: A | Discharge status: Home / Self Care | Payer: BC Managed Care – PPO | Attending: Emergency Medicine | Admitting: Emergency Medicine

## 2022-02-25 ENCOUNTER — Emergency Department (HOSPITAL_COMMUNITY): Payer: BC Managed Care – PPO

## 2022-02-25 ENCOUNTER — Encounter (HOSPITAL_COMMUNITY): Payer: Self-pay | Admitting: *Deleted

## 2022-02-25 DIAGNOSIS — I10 Essential (primary) hypertension: Secondary | ICD-10-CM | POA: Insufficient documentation

## 2022-02-25 DIAGNOSIS — R1031 Right lower quadrant pain: Secondary | ICD-10-CM | POA: Insufficient documentation

## 2022-02-25 LAB — CBC WITH DIFFERENTIAL/PLATELET
Abs Immature Granulocytes: 0.02 10*3/uL (ref 0.00–0.07)
Basophils Absolute: 0 10*3/uL (ref 0.0–0.1)
Basophils Relative: 0 %
Eosinophils Absolute: 0.1 10*3/uL (ref 0.0–0.5)
Eosinophils Relative: 2 %
HCT: 39.2 % (ref 36.0–46.0)
Hemoglobin: 13.5 g/dL (ref 12.0–15.0)
Immature Granulocytes: 0 %
Lymphocytes Relative: 24 %
Lymphs Abs: 1.8 10*3/uL (ref 0.7–4.0)
MCH: 28.4 pg (ref 26.0–34.0)
MCHC: 34.4 g/dL (ref 30.0–36.0)
MCV: 82.4 fL (ref 80.0–100.0)
Monocytes Absolute: 0.6 10*3/uL (ref 0.1–1.0)
Monocytes Relative: 9 %
Neutro Abs: 4.8 10*3/uL (ref 1.7–7.7)
Neutrophils Relative %: 65 %
Platelets: 343 10*3/uL (ref 150–400)
RBC: 4.76 MIL/uL (ref 3.87–5.11)
RDW: 13.4 % (ref 11.5–15.5)
WBC: 7.4 10*3/uL (ref 4.0–10.5)
nRBC: 0 % (ref 0.0–0.2)

## 2022-02-25 LAB — COMPREHENSIVE METABOLIC PANEL
ALT: 15 U/L (ref 0–44)
AST: 18 U/L (ref 15–41)
Albumin: 3.8 g/dL (ref 3.5–5.0)
Alkaline Phosphatase: 79 U/L (ref 38–126)
Anion gap: 7 (ref 5–15)
BUN: 11 mg/dL (ref 6–20)
CO2: 25 mmol/L (ref 22–32)
Calcium: 9.1 mg/dL (ref 8.9–10.3)
Chloride: 106 mmol/L (ref 98–111)
Creatinine, Ser: 0.87 mg/dL (ref 0.44–1.00)
GFR, Estimated: >60 mL/min (ref >60)
Glucose, Bld: 112 mg/dL — ABNORMAL HIGH (ref 70–99)
Potassium: 3.2 mmol/L — ABNORMAL LOW (ref 3.5–5.1)
Sodium: 138 mmol/L (ref 135–145)
Total Bilirubin: 0.4 mg/dL (ref 0.3–1.2)
Total Protein: 7.6 g/dL (ref 6.5–8.1)

## 2022-02-25 LAB — URINALYSIS, ROUTINE W REFLEX MICROSCOPIC
Bilirubin Urine: NEGATIVE
Glucose, UA: NEGATIVE mg/dL
Ketones, ur: NEGATIVE mg/dL
Nitrite: NEGATIVE
Protein, ur: NEGATIVE mg/dL
Specific Gravity, Urine: 1.023 (ref 1.005–1.030)
pH: 5 (ref 5.0–8.0)

## 2022-02-25 LAB — I-STAT BETA HCG BLOOD, ED (MC, WL, AP ONLY): I-stat hCG, quantitative: <5 m[IU]/mL (ref <5)

## 2022-02-25 LAB — LIPASE, BLOOD: Lipase: 32 U/L (ref 11–51)

## 2022-02-25 MED ORDER — MORPHINE SULFATE (PF) 4 MG/ML IV SOLN
4.0000 mg | Freq: Once | INTRAVENOUS | Status: DC
  Filled 2022-02-25: qty 1

## 2022-02-25 MED ORDER — DICYCLOMINE HCL 10 MG/ML IM SOLN
20.0000 mg | Freq: Once | INTRAMUSCULAR | Status: AC
Start: 2022-02-25 — End: 2022-02-25
  Administered 2022-02-25: 20 mg via INTRAMUSCULAR
  Filled 2022-02-25: qty 2

## 2022-02-25 MED ORDER — CEPHALEXIN 500 MG PO CAPS: 500.0000 mg | ORAL_CAPSULE | Freq: Three times a day (TID) | ORAL | 0 refills | Status: AC

## 2022-02-25 MED ORDER — IOHEXOL 300 MG/ML  SOLN
100.0000 mL | Freq: Once | INTRAMUSCULAR | Status: AC | PRN
Start: 2022-02-25 — End: 2022-02-25
  Administered 2022-02-25: 100 mL via INTRAVENOUS

## 2022-02-25 MED ORDER — NAPROXEN 375 MG PO TABS: 375.0000 mg | ORAL_TABLET | Freq: Two times a day (BID) | ORAL | 0 refills | Status: DC

## 2022-02-25 MED ORDER — KETOROLAC TROMETHAMINE 15 MG/ML IJ SOLN
15.0000 mg | Freq: Once | INTRAMUSCULAR | Status: AC
Start: 2022-02-25 — End: 2022-02-25
  Administered 2022-02-25: 15 mg via INTRAVENOUS
  Filled 2022-02-25: qty 1

## 2022-02-25 MED ORDER — POTASSIUM CHLORIDE CRYS ER 20 MEQ PO TBCR
40.0000 meq | EXTENDED_RELEASE_TABLET | Freq: Once | ORAL | Status: AC
Start: 2022-02-25 — End: 2022-02-25
  Administered 2022-02-25: 40 meq via ORAL
  Filled 2022-02-25: qty 2

## 2022-02-25 MED ORDER — DICYCLOMINE HCL 20 MG PO TABS: 20.0000 mg | ORAL_TABLET | Freq: Two times a day (BID) | ORAL | 0 refills | Status: DC

## 2022-02-25 MED ORDER — LACTATED RINGERS IV BOLUS
1000.0000 mL | Freq: Once | INTRAVENOUS | Status: AC
  Administered 2022-02-25: 1000 mL via INTRAVENOUS

## 2022-02-25 MED ORDER — ONDANSETRON 4 MG PO TBDP: 4.0000 mg | ORAL_TABLET | Freq: Three times a day (TID) | ORAL | 0 refills | Status: DC | PRN

## 2022-02-25 MED ORDER — ONDANSETRON HCL 4 MG/2ML IJ SOLN
4.0000 mg | Freq: Once | INTRAMUSCULAR | Status: DC
  Filled 2022-02-25: qty 2

## 2022-02-25 NOTE — ED Provider Notes (Signed)
[facility] Provider Note   CSN: [id number] Arrival date & time: 02/25/22  0844     History  Chief Complaint  Patient presents with   Abdominal Pain    Regina Fisher is a 46 y.o. female.  46 year old female with past medical history that is significant for hypertension, cholecystectomy presents today for evaluation of right lower quadrant abdominal pain that has been going on for about 2 months.  Over the past 2 months the pain has been described as a constant dull ache however over the past 2 days its become sharp.  Patient was previously evaluated by gynecologist and had a ultrasound performed which showed no acute or concerning findings.  Particularly she mentions there were no ovarian cyst.  She denies fever, chills, nausea, vomiting, dysuria, vaginal discharge, change in sexual partner.  Patient states pain was significant last night.  She is typically sleeps on her stomach and was unable to sleep most of last night because of the pain.  The history is provided by the patient. No language interpreter was used.       Home Medications Prior to Admission medications   Medication Sig Start Date End Date Taking? Authorizing Provider  BLACK ELDERBERRY,BERRY-FLOWER, PO Take 1 capsule by mouth daily.     [provider]  hydrochlorothiazide (MICROZIDE) 12.5 MG capsule Take 1 capsule (12.5 mg total) by mouth daily. 01/12/22   Estill Dooms, NP  UNABLE TO FIND Doterra products    [provider]      Allergies    Bee venom and Other    Review of Systems   Review of Systems  Constitutional:  Negative for chills and fever.  Gastrointestinal:  Positive for abdominal pain and diarrhea (Chronic). Negative for constipation, nausea and vomiting.  Genitourinary:  Negative for dysuria, flank pain, hematuria, vaginal bleeding and vaginal discharge.  All other systems reviewed and are negative.   Physical Exam Updated Vital Signs BP (!)  148/102 (BP Location: Left Arm)   Pulse 86   Temp 98.3 F (36.8 C) (Oral)   Resp 18   Ht '5\' 4"'$  (1.626 m)   Wt 99.8 kg   LMP 01/28/2022 (Exact Date)   SpO2 98%   BMI 37.76 kg/m  Physical Exam Vitals and nursing note reviewed.  Constitutional:      General: She is not in acute distress.    Appearance: Normal appearance. She is not ill-appearing.  HENT:     Head: Normocephalic and atraumatic.     Nose: Nose normal.  Eyes:     General: No scleral icterus.    Extraocular Movements: Extraocular movements intact.     Conjunctiva/sclera: Conjunctivae normal.  Cardiovascular:     Rate and Rhythm: Normal rate and regular rhythm.     Pulses: Normal pulses.  Pulmonary:     Effort: Pulmonary effort is normal. No respiratory distress.     Breath sounds: Normal breath sounds. No wheezing or rales.  Abdominal:     General: There is no distension.     Tenderness: There is abdominal tenderness (Right lower quadrant). There is no right CVA tenderness, left CVA tenderness or guarding.  Musculoskeletal:        General: Normal range of motion.     Cervical back: Normal range of motion.  Skin:    General: Skin is warm and dry.  Neurological:     General: No focal deficit present.     Mental Status: She is alert. Mental status is  at baseline.     ED Results / Procedures / Treatments   Labs (all labs ordered are listed, but only abnormal results are displayed) Labs Reviewed  CBC WITH DIFFERENTIAL/PLATELET  COMPREHENSIVE METABOLIC PANEL  LIPASE, BLOOD  URINALYSIS, ROUTINE W REFLEX MICROSCOPIC  I-STAT BETA HCG BLOOD, ED ([facility], [facility], [facility] ONLY)    EKG None  Radiology No results found.  Procedures Procedures    Medications Ordered in ED Medications  lactated ringers bolus 1,000 mL (has no administration in time range)  ondansetron (ZOFRAN) injection 4 mg (has no administration in time range)  morphine (PF) 4 MG/ML injection 4 mg (has no administration in time range)    ED Course/  Medical Decision Making/ A&P Clinical Course as of 02/25/22 1335  Thu Feb 25, 2022  1233 Patient resting comfortably on exam without close.  Still reports right lower quadrant abdominal pain.  She did refused initial pain medications that were ordered for her.  States she does not like narcotic pain medicine.  Will provide her with Toradol, Bentyl.  Workup overall reassuring.  CBC without leukocytosis or anemia.  CMP shows mild hypokalemia of 3.2, glucose of 112 otherwise without acute findings.  Will provide p.o. potassium repletion.  Lipase within normal limits.  hCG negative.  UA with trace leukocytes, and moderate hemoglobin, with hazy appearance.  Patient is on her menstrual cycle.  However given these changes and right lower quadrant abdominal pain will cover her for UTI.  CT scan does not show any evidence of acute intra-abdominal etiology of patient's abdominal pain.  Discussion had with patient regarding repeating pelvic ultrasound, however she defers and states she just had 1 done a few weeks ago and there has not been much change in her pain.  I believe this is reasonable.  Given the abdominal pain has been ongoing for couple months with only minimal changes the past couple days and overall comfortable on exam with reassuring workup low suspicion for ovarian torsion. [AA]    Clinical Course User Index [AA] Evlyn Courier, PA-C                           Medical Decision Making Amount and/or Complexity of Data Reviewed Labs: ordered. Radiology: ordered.  Risk Prescription drug management.   Medical Decision Making / ED Course   This patient presents to the ED for concern of right lower quadrant abdominal pain, this involves an extensive number of treatment options, and is a complaint that carries with it a high risk of complications and morbidity.  The differential diagnosis includes appendicitis, PID, UTI, gastroenteritis  MDM: 46 year old female with past medical history significant  for hypertension, cholecystectomy presents today for evaluation of right lower quadrant abdominal pain ongoing for about 2 months.  Pain became sharp in nature 2 days ago.  Denies nausea, vomiting, dysuria, vaginal discharge, recent change in sexual partners.  Previously evaluated by gynecology and had a pelvic ultrasound done which was normal.  Will evaluate with labs, and CT imaging to rule out appendicitis and other acute etiology. Patient following Toradol and Bentyl reports some improvement in pain.  Given her urine we will treat her for UTI.  Will prescribe patient naproxen and Bentyl.  Patient states she will be able to follow-up with gynecology and her PCP.  Patient is stable for discharge.  Discharged in stable condition.  Return precautions discussed.   Additional history obtained: -Additional history obtained from recent gynecology visit and pelvic  ultrasound.  Pelvic ultrasound showed no acute process. -External records from outside source obtained and reviewed including: Chart review including previous notes, labs, imaging, consultation notes   Lab Tests: -I ordered, reviewed, and interpreted labs.   The pertinent results include:   Labs Reviewed  COMPREHENSIVE METABOLIC PANEL - Abnormal; Notable for the following components:      Result Value   Potassium 3.2 (*)    Glucose, Bld 112 (*)    All other components within normal limits  URINALYSIS, ROUTINE W REFLEX MICROSCOPIC - Abnormal; Notable for the following components:   APPearance HAZY (*)    Hgb urine dipstick MODERATE (*)    Leukocytes,Ua TRACE (*)    Bacteria, UA RARE (*)    All other components within normal limits  CBC WITH DIFFERENTIAL/PLATELET  LIPASE, BLOOD  I-STAT BETA HCG BLOOD, ED ([facility], [facility], [facility] ONLY)      EKG  EKG Interpretation  Date/Time:    Ventricular Rate:    PR Interval:    QRS Duration:   QT Interval:    QTC Calculation:   R Axis:     Text Interpretation:           Imaging Studies  ordered: I ordered imaging studies including CT abdomen pelvis with contrast I independently visualized and interpreted imaging. I agree with the radiologist interpretation   Medicines ordered and prescription drug management: Meds ordered this encounter  Medications   lactated ringers bolus 1,000 mL   DISCONTD: ondansetron (ZOFRAN) injection 4 mg   DISCONTD: morphine (PF) 4 MG/ML injection 4 mg   iohexol (OMNIPAQUE) 300 MG/ML solution 100 mL   ketorolac (TORADOL) 15 MG/ML injection 15 mg   dicyclomine (BENTYL) injection 20 mg   potassium chloride SA (KLOR-CON M) CR tablet 40 mEq    -I have reviewed the patients home medicines and have made adjustments as needed  Reevaluation: After the interventions noted above, I reevaluated the patient and found that they have :improved  Co morbidities that complicate the patient evaluation  Past Medical History:  Diagnosis Date   Hypertension    Tumor of optic nerve       Dispostion: Patient is appropriate for discharge.  Discharged in stable condition.  Return precautions discussed.    Final Clinical Impression(s) / ED Diagnoses Final diagnoses:  Right lower quadrant abdominal pain    Rx / DC Orders ED Discharge Orders          Ordered    dicyclomine (BENTYL) 20 MG tablet  2 times daily        02/25/22 1341    ondansetron (ZOFRAN-ODT) 4 MG disintegrating tablet  Every 8 hours PRN        02/25/22 1341    cephALEXin (KEFLEX) 500 MG capsule  3 times daily        02/25/22 1341    naproxen (NAPROSYN) 375 MG tablet  2 times daily        02/25/22 1341              Evlyn Courier, PA-C 02/25/22 1400    Elgie Congo, MD 02/25/22 1655

## 2022-02-25 NOTE — ED Notes (Signed)
Patient prefers to not have morphine and zofran at this time. Will monitor pain and nausea.

## 2022-02-25 NOTE — Discharge Instructions (Addendum)
Your workup today was overall reassuring.  CT scan did not show any concerning findings as a source of your right lower quadrant abdominal pain.  Workup did not show any concerning findings either.  Your urine did show some indication of potential UTI.  I will send urine culture to confirm in the meantime I will start you on antibiotics.  I have also given you Bentyl and naproxen to help with your pain.  Please call and follow-up with your gynecologist and your PCP.  If you have any worsening symptoms such as worsening abdominal pain, fever, or other concerning symptoms please return to the emergency room.

## 2022-02-25 NOTE — ED Triage Notes (Signed)
Pt c/o RLQ abdominal pain that started a few months ago and pt thought it was ovarian cysts. Pt was checked out and told she didn't have ovarian cysts. Now over the past couple of days she has been having strong, sharp pain in RLQ area along with n/v/d. Pt does have her gallbladder removed so she feels the diarrhea is her normal.

## 2022-02-27 LAB — URINE CULTURE

## 2022-03-09 ENCOUNTER — Ambulatory Visit: Payer: BC Managed Care – PPO | Admitting: Adult Health

## 2022-03-16 ENCOUNTER — Encounter: Payer: Self-pay | Admitting: Adult Health

## 2022-03-16 ENCOUNTER — Ambulatory Visit: Payer: BC Managed Care – PPO | Admitting: Adult Health

## 2022-03-16 VITALS — BP 139/92 | HR 96 | Ht 64.25 in | Wt 221.0 lb

## 2022-03-16 DIAGNOSIS — I10 Essential (primary) hypertension: Secondary | ICD-10-CM

## 2022-03-16 DIAGNOSIS — R1031 Right lower quadrant pain: Secondary | ICD-10-CM

## 2022-03-16 NOTE — Progress Notes (Signed)
  Subjective:     Patient ID: Regina Fisher, female   DOB: April 24, 1976, 46 y.o.   MRN: [id number]  HPI Regina Fisher is a 46 year old white female, married, G0P0, in for follow up on BP, she keeps BP log on phone and is still elevated but better, when takes BP meds. She has lost 4 lbs.  She says RLQ pain is better, has normal pelvic US and hen was seen in ER 02/25/22 for pain and had CT, hsa mild diverticulosis. She has started taking tumeric and a Doterra supplement and feels better, she thinks was IBS. Last pap 09/03/19. High risk HPV Negative   Adequacy Satisfactory for evaluation; transformation zone component PRESENT.   Diagnosis - Negative for intraepithelial lesion or malignancy (NILM)   Microorganisms Fungal organisms present consistent with Candida spp.   PCP is Dr Gerarda Fraction.  Review of Systems RLQ pain better Reviewed past medical,surgical, social and family history. Reviewed medications and allergies.     Objective:   Physical Exam BP (!) 139/92 (BP Location: Left Arm, Patient Position: Sitting, Cuff Size: Large)   Pulse 96   Ht 5' 4.25" (1.632 m)   Wt 221 lb (100.2 kg)   LMP 02/25/2022 (Exact Date) Comment: beta neg  BMI 37.64 kg/m     Skin warm and dry.  Lungs: clear to ausculation bilaterally. Cardiovascular: regular rate and rhythm.   Upstream - 03/16/22 2703       Pregnancy Intention Screening   Does the patient want to become pregnant in the next year? No    Does the patient's partner want to become pregnant in the next year? No    Would the patient like to discuss contraceptive options today? No      Contraception Wrap Up   Current Method Vasectomy    End Method Vasectomy             Assessment:     1. Hypertension, unspecified type Try to take Microzide 12.5 mg daily,has refills Keep check on BP at home  Watch salt and sugars Follow up in 3 months for recheck   2. RLQ abdominal pain Feels better If gets worse again will refer to GI    Plan:     Follow up  in 3 months

## 2022-06-25 IMAGING — US US BREAST*L* LIMITED INC AXILLA
1 series · 6 of 6 positions shown · non-contrast
Comparison: Previous exam(s).

CLINICAL DATA: Follow-up for a probably benign left breast mass,
initially assessed with diagnostic imaging on 10/23/2019.

EXAM:
DIGITAL DIAGNOSTIC LEFT MAMMOGRAM WITH CAD AND TOMO
ULTRASOUND LEFT BREAST

[Series 1: us breast*left* limited inc axilla · 0.07mm/px · 6 of 6 slices shown]
[im 1/6]
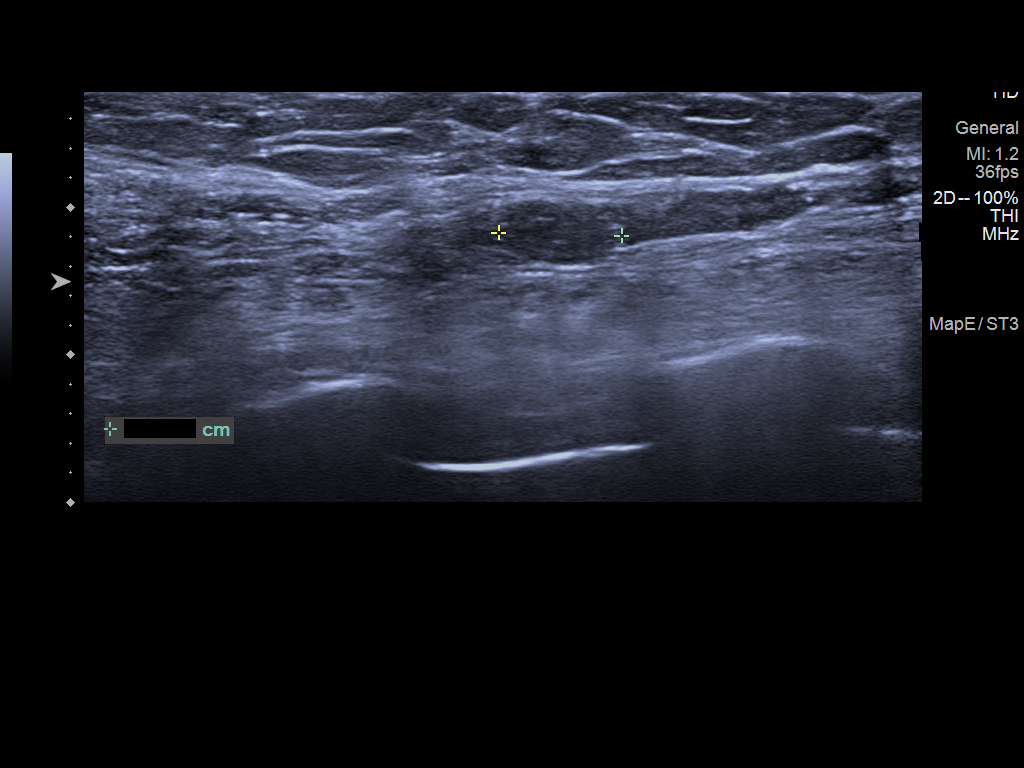
[im 2/6]
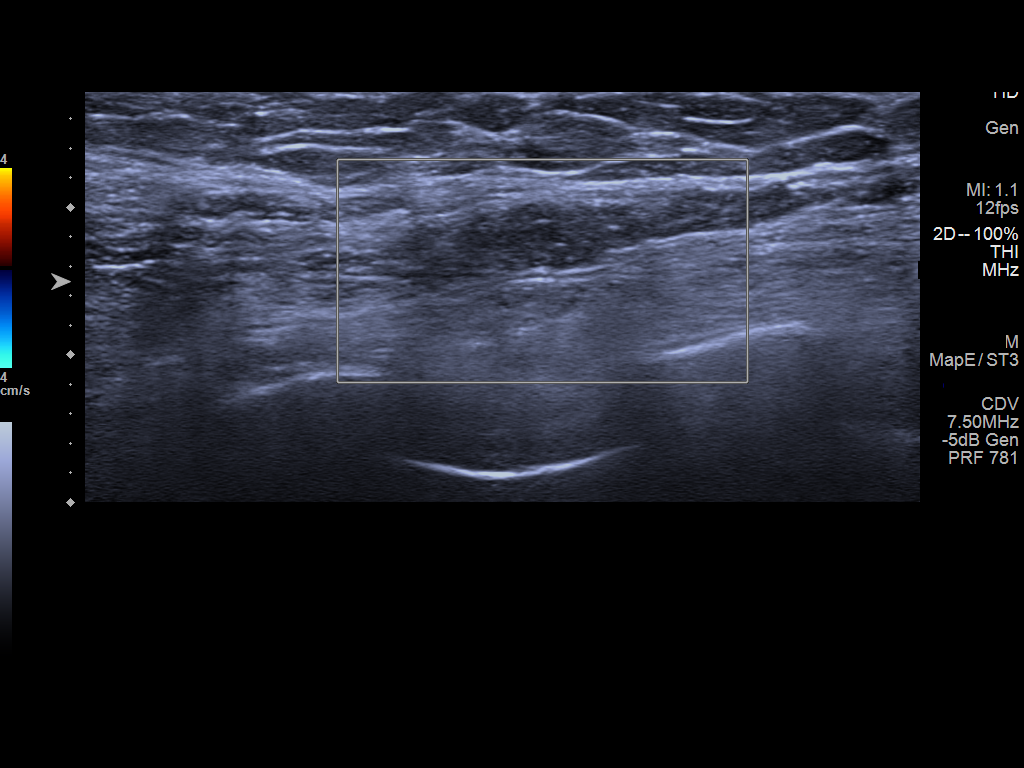
[im 3/6]
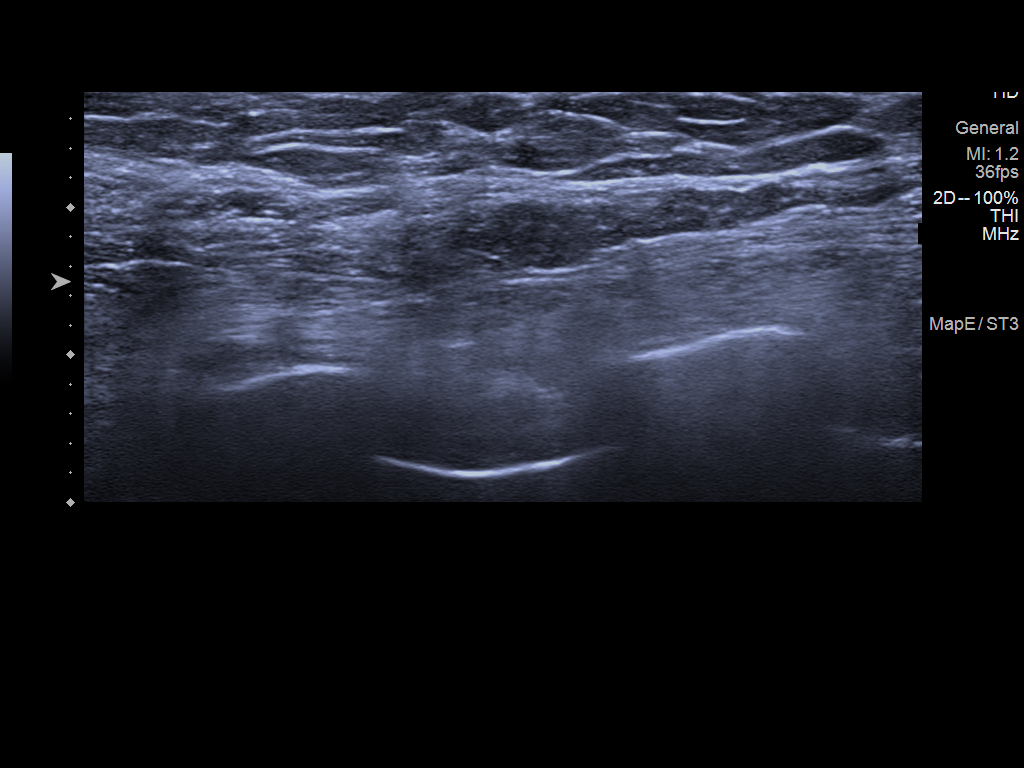
[im 4/6]
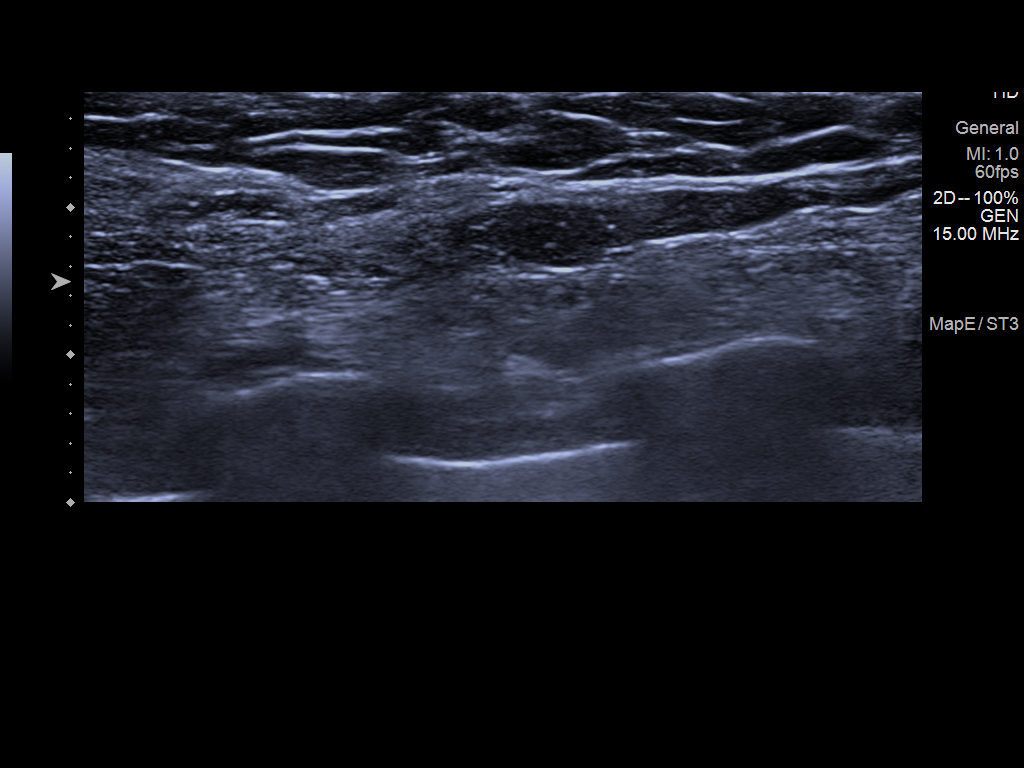
[im 5/6]
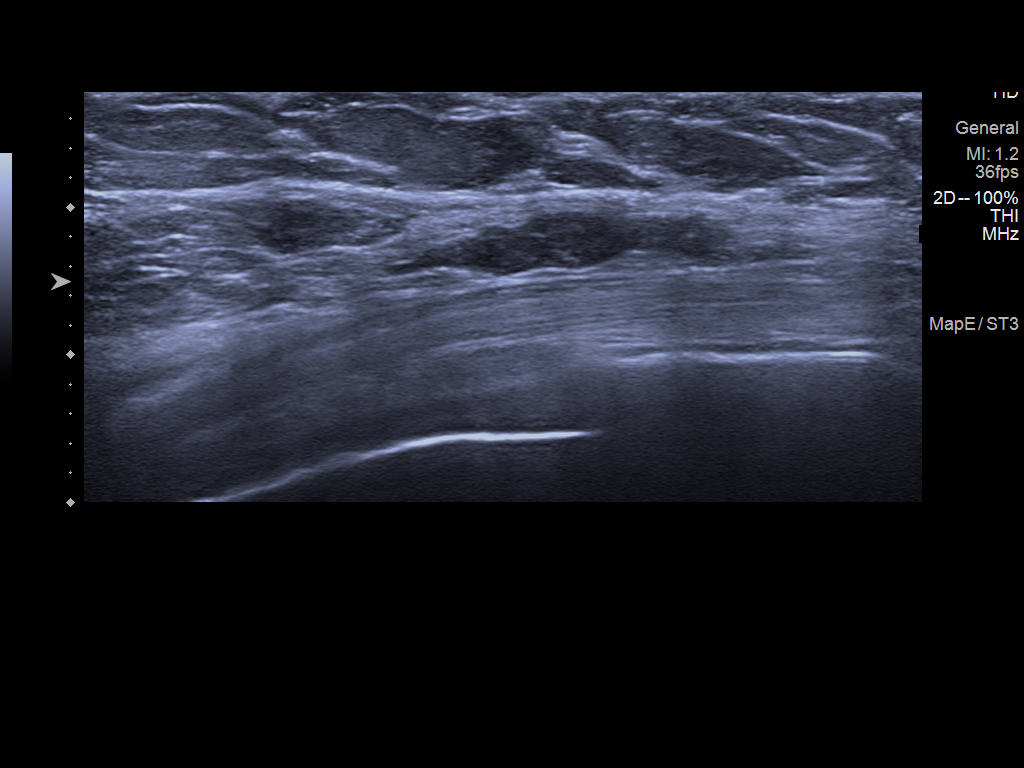
[im 6/6]
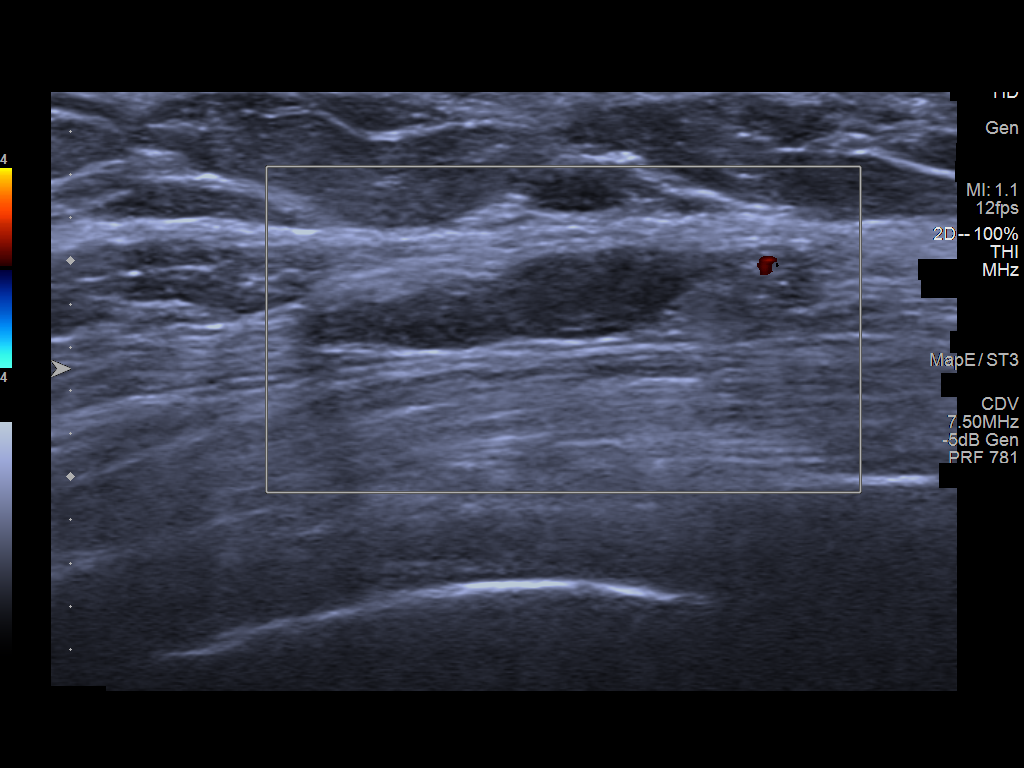

[6 of 6 positions shown; findings below may reference images not displayed]

ACR Breast Density Category b: There are scattered areas of
fibroglandular density.
FINDINGS: The focal opacity in the posterolateral aspect of the left breast is
without significant change the most recent prior screening study and
follow-up diagnostic exam. It has mixed attenuation partly
circumscribed and partly ill-defined margins. It measures
approximately 1.7 cm greatest dimension mammographically. There are
no new masses/asymmetries, areas of architectural distortion or
suspicious calcifications.

Mammographic images were processed with CAD.

Targeted ultrasound is performed, showing a subtle hypoechoic area
in the posterior aspect of the left breast at 3 o'clock, 7 cm the
nipple, which is essentially isoechoic to breast fat. It measures
approximately 1.3 x 0.4 x 0.8 cm. This corresponds to previously
measuring lesion in the same location, which measured 1.3 x 0.5 x
1.0 cm. Sonographic evaluation of the lateral left breast is
otherwise unremarkable.
IMPRESSION: 1. Probably benign mass or asymmetry in the posterolateral aspect of
the left breast. Probable corresponding sonographic finding could
reflect a an isoechoic fibroadenoma or an island normal fibrofatty
tissue. This this area has been stable since the prior diagnostic
and screening exam.

RECOMMENDATION:
1. Additional short-term follow-up. Recommend diagnostic mammography
and left breast ultrasound in October 2020.

I have discussed the findings and recommendations with the patient.
If applicable, a reminder letter will be sent to the patient
regarding the next appointment.

BI-RADS CATEGORY  3: Probably benign.

## 2022-08-05 ENCOUNTER — Other Ambulatory Visit: Payer: Self-pay | Admitting: Adult Health

## 2022-08-05 DIAGNOSIS — R1031 Right lower quadrant pain: Secondary | ICD-10-CM

## 2022-08-05 NOTE — Progress Notes (Signed)
Refer to GI 

## 2022-08-10 ENCOUNTER — Ambulatory Visit (INDEPENDENT_AMBULATORY_CARE_PROVIDER_SITE_OTHER): Payer: BC Managed Care – PPO | Admitting: Gastroenterology

## 2022-08-10 ENCOUNTER — Encounter: Payer: Self-pay | Admitting: *Deleted

## 2022-08-10 ENCOUNTER — Encounter: Payer: Self-pay | Admitting: Gastroenterology

## 2022-08-10 VITALS — BP 129/85 | HR 98 | Temp 97.8°F | Ht 64.25 in | Wt 210.8 lb

## 2022-08-10 DIAGNOSIS — Z1211 Encounter for screening for malignant neoplasm of colon: Secondary | ICD-10-CM

## 2022-08-10 DIAGNOSIS — Z1212 Encounter for screening for malignant neoplasm of rectum: Secondary | ICD-10-CM

## 2022-08-10 DIAGNOSIS — R1031 Right lower quadrant pain: Secondary | ICD-10-CM

## 2022-08-10 MED ORDER — CLENPIQ 10-3.5-12 MG-GM -GM/175ML PO SOLN: 1.0000 | ORAL | 0 refills | Status: DC

## 2022-08-10 NOTE — Addendum Note (Signed)
Addended by: Cheron Every on: 08/10/2022 08:47 AM   Modules accepted: Orders

## 2022-08-10 NOTE — Patient Instructions (Signed)
We arranging a colonoscopy in the near future with Dr. Abbey Chatters!  Please let me know if anything worsens or you would like to try any medication.  Further recommendations to follow!  It was a pleasure to see you today. I want to create trusting relationships with patients and provide genuine, compassionate, and quality care. I truly value your feedback, so please be on the lookout for a survey regarding your visit with me today. I appreciate your time in completing this!    Annitta Needs, PhD, ANP-BC [facility]

## 2022-08-10 NOTE — H&P (View-Only) (Signed)
Gastroenterology Office Note    Referring Provider: Estill Dooms, NP Primary Care Physician:  Redmond School, MD  Primary GI: Dr. Abbey Chatters    Chief Complaint   Chief Complaint  Patient presents with   Abdominal Pain    Patient here today due to having RLQ pain on going 8-9 months. Denies any nausea or vomiting. Has diarrhea every since having gallbladder removed in March of 2020.     History of Present Illness   Regina Fisher is a pleasant  47 y.o. female presenting today at the request of Derrek Monaco, NP, due to chronic RLQ abdominal pain.  Patient notes onset of RLQ pain about 8-9 months ago. Saw GYN and ruled out anything GYN related.  Went to ED in Aug due to RLQ pain. Pinpoint.Feels like it's in a fat pocket. Has high pain tolerance. Will notice the pain at times more than others. No obvious precipitating or relieving factors. Has worsened when laying on stomach. No obvious bulging.   Since cholecystectomy in 2020 has had intermittent loose stool. Usually on the looser side since cholecystectomy. One day will eat something and be fine then next day has diarrhea. Will have 4-5 loose stools in 2.5 hours then improved. Declining any prescriptive agents. Prefers not to take something if not needed. This is not bothering her, and she notes it directly linked to cholecystectomy.   CT Aug 2023 without acute abnormality.   Past Medical History:  Diagnosis Date   Hypertension    Tumor of optic nerve     Past Surgical History:  Procedure Laterality Date   bone spur     CHOLECYSTECTOMY N/A 09/27/2018   Procedure: LAPAROSCOPIC CHOLECYSTECTOMY;  Surgeon: Aviva Signs, MD;  Location: [facility];  Service: General;  Laterality: N/A;   fatty turmor removal back     gamma knife 2018     ganna knight     ROTATOR CUFF REPAIR Right 04/08/2021   TENDON REPAIR      Current Outpatient Medications  Medication Sig Dispense Refill   acetaminophen (TYLENOL) 325 MG tablet Take  650 mg by mouth every 6 (six) hours as needed.     ELDERBERRY PO Take by mouth. As needed     hydrochlorothiazide (MICROZIDE) 12.5 MG capsule Take 1 capsule (12.5 mg total) by mouth daily. 30 capsule 6   UNABLE TO FIND Tumeric capsules-2 in the am; Copaiba-2 capsule qhs; Terazyme-TID     No current facility-administered medications for this visit.    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   Other Anaphylaxis 10/15/2021    Family History  Problem Relation Age of Onset   Hypertension Mother    Diabetes Father    Gallbladder disease Father    Mental illness Brother    Osteoporosis Maternal Grandmother    Colon cancer Neg Hx    Colon polyps Neg Hx    Inflammatory bowel disease Neg Hx     Social History   Socioeconomic History   Marital status: Married    Spouse name: Not on file   Number of children: 0   Years of education: Not on file   Highest education level: Not on file  Occupational History   Not on file  Tobacco Use   Smoking status: Never   Smokeless tobacco: Never  Vaping Use   Vaping Use: Never used  Substance and Sexual Activity   Alcohol use: Never  Drug use: Never   Sexual activity: Yes    Birth control/protection: Surgical    Comment: vasectomy  Other Topics Concern   Not on file  Social History Narrative   Not on file   Social Determinants of Health   Financial Resource Strain: Low Risk  (10/15/2021)   Overall Financial Resource Strain (CARDIA)    Difficulty of Paying Living Expenses: Not hard at all  Food Insecurity: No Food Insecurity (10/15/2021)   Hunger Vital Sign    Worried About Running Out of Food in the Last Year: Never true    Ran Out of Food in the Last Year: Never true  Transportation Needs: No Transportation Needs (10/15/2021)   PRAPARE - Hydrologist (Medical): No    Lack of Transportation (Non-Medical): No  Physical Activity:  Insufficiently Active (10/15/2021)   Exercise Vital Sign    Days of Exercise per Week: 2 days    Minutes of Exercise per Session: 20 min  Stress: Stress Concern Present (10/15/2021)   [place]    Feeling of Stress : To some extent  Social Connections: Moderately Integrated (10/15/2021)   Social Connection and Isolation Panel [NHANES]    Frequency of Communication with Friends and Family: More than three times a week    Frequency of Social Gatherings with Friends and Family: Three times a week    Attends Religious Services: 1 to 4 times per year    Active Member of Clubs or Organizations: No    Attends Archivist Meetings: Never    Marital Status: Married  Human resources officer Violence: Not At Risk (10/15/2021)   Humiliation, Afraid, Rape, and Kick questionnaire    Fear of Current or Ex-Partner: No    Emotionally Abused: No    Physically Abused: No    Sexually Abused: No     Review of Systems   Gen: Denies any fever, chills, fatigue, weight loss, lack of appetite.  CV: Denies chest pain, heart palpitations, peripheral edema, syncope.  Resp: Denies shortness of breath at rest or with exertion. Denies wheezing or cough.  GI: Denies dysphagia or odynophagia. Denies jaundice, hematemesis, fecal incontinence. GU : Denies urinary burning, urinary frequency, urinary hesitancy MS: Denies joint pain, muscle weakness, cramps, or limitation of movement.  Derm: Denies rash, itching, dry skin Psych: Denies depression, anxiety, memory loss, and confusion Heme: Denies bruising, bleeding, and enlarged lymph nodes.   Physical Exam   BP 129/85 (BP Location: Left Arm, Patient Position: Sitting, Cuff Size: Large)   Pulse 98   Temp 97.8 F (36.6 C) (Temporal)   Ht 5' 4.25" (1.632 m)   Wt 210 lb 12.8 oz (95.6 kg)   BMI 35.90 kg/m  General:   Alert and oriented. Pleasant and cooperative. Well-nourished and well-developed.   Head:  Normocephalic and atraumatic. Eyes:  Without icterus Ears:  Normal auditory acuity. Lungs:  Clear to auscultation bilaterally.  Heart:  S1, S2 present without murmurs appreciated.  Abdomen:  +BS, soft, mild pinpoint discomfort RLQ and non-distended. No HSM noted. No guarding or rebound. No masses appreciated.  Rectal:  Deferred  Msk:  Symmetrical without gross deformities. Normal posture. Extremities:  Without edema. Neurologic:  Alert and  oriented x4;  grossly normal neurologically. Skin:  Intact without significant lesions or rashes. Psych:  Alert and cooperative. Normal mood and affect.   Assessment   Regina Fisher is a 47 y.o. female presenting today at the request  of Derrek Monaco, NP, due to chronic RLQ abdominal pain.  RLQ abdominal pain: onset about 8-9 months ago without precipitating or relieving factors. Waxes and wanes in intensity. CT Aug 2023 without obvious acute findings. GYN etiology ruled out. On exam, no obvious hernia. Not related to bowel habits. Unclear etiology at this point. Reassuring that CT is negative. She is declining any anti-spasmodics at this point. She is due for routine initial screening colonoscopy anyway, so we can go ahead and set this up for screening purposes. If pain worsens, update CT. May need surgical referral, ?occult hernia  Loose stool: suspected bile-salt diarrhea as onset followed cholecystectomy. This is not affecting quality of life. No alarm signs/symptoms. Declining any prescriptive agents.      PLAN   Proceed with colonoscopy by Dr. Abbey Chatters  in near future for screening purposes: the risks, benefits, and alternatives have been discussed with the patient in detail. The patient states understanding and desires to proceed.   Further recommendations to follow     Annitta Needs, PhD, ANP-BC [facility]

## 2022-08-10 NOTE — Progress Notes (Signed)
  Gastroenterology Office Note    Referring Provider: Griffin, Jennifer A, NP Primary Care Physician:  Fusco, Lawrence, MD  Primary GI: Dr. Carver    Chief Complaint   Chief Complaint  Patient presents with   Abdominal Pain    Patient here today due to having RLQ pain on going 8-9 months. Denies any nausea or vomiting. Has diarrhea every since having gallbladder removed in March of 2020.     History of Present Illness   Regina Fisher is a pleasant  46 y.o. female presenting today at the request of Jennifer Griffin, NP, due to chronic RLQ abdominal pain.  Patient notes onset of RLQ pain about 8-9 months ago. Saw GYN and ruled out anything GYN related.  Went to ED in Aug due to RLQ pain. Pinpoint.Feels like it's in a fat pocket. Has high pain tolerance. Will notice the pain at times more than others. No obvious precipitating or relieving factors. Has worsened when laying on stomach. No obvious bulging.   Since cholecystectomy in 2020 has had intermittent loose stool. Usually on the looser side since cholecystectomy. One day will eat something and be fine then next day has diarrhea. Will have 4-5 loose stools in 2.5 hours then improved. Declining any prescriptive agents. Prefers not to take something if not needed. This is not bothering her, and she notes it directly linked to cholecystectomy.   CT Aug 2023 without acute abnormality.   Past Medical History:  Diagnosis Date   Hypertension    Tumor of optic nerve     Past Surgical History:  Procedure Laterality Date   bone spur     CHOLECYSTECTOMY N/A 09/27/2018   Procedure: LAPAROSCOPIC CHOLECYSTECTOMY;  Surgeon: Jenkins, Mark, MD;  Location: [facility];  Service: General;  Laterality: N/A;   fatty turmor removal back     gamma knife 2018     ganna knight     ROTATOR CUFF REPAIR Right 04/08/2021   TENDON REPAIR      Current Outpatient Medications  Medication Sig Dispense Refill   acetaminophen (TYLENOL) 325 MG tablet Take  650 mg by mouth every 6 (six) hours as needed.     ELDERBERRY PO Take by mouth. As needed     hydrochlorothiazide (MICROZIDE) 12.5 MG capsule Take 1 capsule (12.5 mg total) by mouth daily. 30 capsule 6   UNABLE TO FIND Tumeric capsules-2 in the am; Copaiba-2 capsule qhs; Terazyme-TID     No current facility-administered medications for this visit.    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   Other Anaphylaxis 10/15/2021    Family History  Problem Relation Age of Onset   Hypertension Mother    Diabetes Father    Gallbladder disease Father    Mental illness Brother    Osteoporosis Maternal Grandmother    Colon cancer Neg Hx    Colon polyps Neg Hx    Inflammatory bowel disease Neg Hx     Social History   Socioeconomic History   Marital status: Married    Spouse name: Not on file   Number of children: 0   Years of education: Not on file   Highest education level: Not on file  Occupational History   Not on file  Tobacco Use   Smoking status: Never   Smokeless tobacco: Never  Vaping Use   Vaping Use: Never used  Substance and Sexual Activity   Alcohol use: Never     Drug use: Never   Sexual activity: Yes    Birth control/protection: Surgical    Comment: vasectomy  Other Topics Concern   Not on file  Social History Narrative   Not on file   Social Determinants of Health   Financial Resource Strain: Low Risk  (10/15/2021)   Overall Financial Resource Strain (CARDIA)    Difficulty of Paying Living Expenses: Not hard at all  Food Insecurity: No Food Insecurity (10/15/2021)   Hunger Vital Sign    Worried About Running Out of Food in the Last Year: Never true    Ran Out of Food in the Last Year: Never true  Transportation Needs: No Transportation Needs (10/15/2021)   PRAPARE - Transportation    Lack of Transportation (Medical): No    Lack of Transportation (Non-Medical): No  Physical Activity:  Insufficiently Active (10/15/2021)   Exercise Vital Sign    Days of Exercise per Week: 2 days    Minutes of Exercise per Session: 20 min  Stress: Stress Concern Present (10/15/2021)   [employer] - Occupational Stress Questionnaire    Feeling of Stress : To some extent  Social Connections: Moderately Integrated (10/15/2021)   Social Connection and Isolation Panel [NHANES]    Frequency of Communication with Friends and Family: More than three times a week    Frequency of Social Gatherings with Friends and Family: Three times a week    Attends Religious Services: 1 to 4 times per year    Active Member of Clubs or Organizations: No    Attends Club or Organization Meetings: Never    Marital Status: Married  Intimate Partner Violence: Not At Risk (10/15/2021)   Humiliation, Afraid, Rape, and Kick questionnaire    Fear of Current or Ex-Partner: No    Emotionally Abused: No    Physically Abused: No    Sexually Abused: No     Review of Systems   Gen: Denies any fever, chills, fatigue, weight loss, lack of appetite.  CV: Denies chest pain, heart palpitations, peripheral edema, syncope.  Resp: Denies shortness of breath at rest or with exertion. Denies wheezing or cough.  GI: Denies dysphagia or odynophagia. Denies jaundice, hematemesis, fecal incontinence. GU : Denies urinary burning, urinary frequency, urinary hesitancy MS: Denies joint pain, muscle weakness, cramps, or limitation of movement.  Derm: Denies rash, itching, dry skin Psych: Denies depression, anxiety, memory loss, and confusion Heme: Denies bruising, bleeding, and enlarged lymph nodes.   Physical Exam   BP 129/85 (BP Location: Left Arm, Patient Position: Sitting, Cuff Size: Large)   Pulse 98   Temp 97.8 F (36.6 C) (Temporal)   Ht 5' 4.25" (1.632 m)   Wt 210 lb 12.8 oz (95.6 kg)   BMI 35.90 kg/m  General:   Alert and oriented. Pleasant and cooperative. Well-nourished and well-developed.   Head:  Normocephalic and atraumatic. Eyes:  Without icterus Ears:  Normal auditory acuity. Lungs:  Clear to auscultation bilaterally.  Heart:  S1, S2 present without murmurs appreciated.  Abdomen:  +BS, soft, mild pinpoint discomfort RLQ and non-distended. No HSM noted. No guarding or rebound. No masses appreciated.  Rectal:  Deferred  Msk:  Symmetrical without gross deformities. Normal posture. Extremities:  Without edema. Neurologic:  Alert and  oriented x4;  grossly normal neurologically. Skin:  Intact without significant lesions or rashes. Psych:  Alert and cooperative. Normal mood and affect.   Assessment   Regina Fisher is a 46 y.o. female presenting today at the request   of Jennifer Griffin, NP, due to chronic RLQ abdominal pain.  RLQ abdominal pain: onset about 8-9 months ago without precipitating or relieving factors. Waxes and wanes in intensity. CT Aug 2023 without obvious acute findings. GYN etiology ruled out. On exam, no obvious hernia. Not related to bowel habits. Unclear etiology at this point. Reassuring that CT is negative. She is declining any anti-spasmodics at this point. She is due for routine initial screening colonoscopy anyway, so we can go ahead and set this up for screening purposes. If pain worsens, update CT. May need surgical referral, ?occult hernia  Loose stool: suspected bile-salt diarrhea as onset followed cholecystectomy. This is not affecting quality of life. No alarm signs/symptoms. Declining any prescriptive agents.      PLAN   Proceed with colonoscopy by Dr. Carver  in near future for screening purposes: the risks, benefits, and alternatives have been discussed with the patient in detail. The patient states understanding and desires to proceed.   Further recommendations to follow     Regina Fisher W. Ashtian Villacis, PhD, ANP-BC [facility]    

## 2022-08-19 ENCOUNTER — Ambulatory Visit: Payer: BC Managed Care – PPO | Admitting: Adult Health

## 2022-08-19 ENCOUNTER — Encounter: Payer: Self-pay | Admitting: Adult Health

## 2022-08-19 ENCOUNTER — Other Ambulatory Visit (HOSPITAL_COMMUNITY)
Admission: RE | Admit: 2022-08-19 | Discharge: 2022-08-19 | Disposition: A | Discharge status: Home / Self Care | Payer: BC Managed Care – PPO | Source: Ambulatory Visit | Attending: Adult Health | Admitting: Adult Health

## 2022-08-19 VITALS — BP 133/84 | HR 88 | Ht 64.25 in | Wt 216.0 lb

## 2022-08-19 DIAGNOSIS — I10 Essential (primary) hypertension: Secondary | ICD-10-CM

## 2022-08-19 DIAGNOSIS — Z01419 Encounter for gynecological examination (general) (routine) without abnormal findings: Secondary | ICD-10-CM

## 2022-08-19 DIAGNOSIS — Z1211 Encounter for screening for malignant neoplasm of colon: Secondary | ICD-10-CM

## 2022-08-19 DIAGNOSIS — R1031 Right lower quadrant pain: Secondary | ICD-10-CM | POA: Diagnosis not present

## 2022-08-19 LAB — HEMOCCULT GUIAC POC 1CARD (OFFICE): Fecal Occult Blood, POC: NEGATIVE

## 2022-08-19 MED ORDER — HYDROCHLOROTHIAZIDE 12.5 MG PO CAPS: 12.5000 mg | ORAL_CAPSULE | Freq: Every day | ORAL | 12 refills | Status: AC

## 2022-08-19 NOTE — Progress Notes (Signed)
Patient ID: Regina Fisher, female   DOB: 1975-08-17, 47 y.o.   MRN: [id number] History of Present Illness: Regina Fisher is a 47 year old white female,married, G0P0, in for a well woman gyn exam and pap. She still has pain RLQ at times, is having colonoscopy 09/09/22 with Dr Abbey Chatters.  PCP is Dr Gerarda Fraction.   Current Medications, Allergies, Past Medical History, Past Surgical History, Family History and Social History were reviewed in [facility] record.     Review of Systems: Patient denies any headaches, hearing loss, fatigue, blurred vision, shortness of breath, chest pain, problems with bowel movements(since GB out has constipation or diarrhea), urination, or intercourse. No joint pain or mood swings.  She still has pain RLQ at times, is having colonoscopy 09/09/22 with Dr Abbey Chatters.    Physical Exam:BP 133/84 (BP Location: Left Arm, Patient Position: Sitting, Cuff Size: Normal)   Pulse 88   Ht 5' 4.25" (1.632 m)   Wt 216 lb (98 kg)   LMP 08/11/2022   BMI 36.79 kg/m   General:  Well developed, well nourished, no acute distress Skin:  Warm and dry Neck:  Midline trachea, normal thyroid, good ROM, no lymphadenopathy Lungs; Clear to auscultation bilaterally Breast:  No dominant palpable mass, retraction, or nipple discharge Cardiovascular: Regular rate and rhythm Abdomen:  Soft, non tender, no hepatosplenomegaly Pelvic:  External genitalia is normal in appearance, no lesions.  The vagina is normal in appearance. Urethra has no lesions or masses. The cervix is bulbous.pap with HR HPV genotyping performed.   Uterus is felt to be normal size, shape, and contour.  No adnexal masses or tenderness noted.Bladder is non tender, no masses felt. Rectal: Good sphincter tone, no polyps, + hemorrhoids felt.  Hemoccult negative. Extremities/musculoskeletal:  No swelling or varicosities noted, no clubbing or cyanosis Psych:  No mood changes, alert and cooperative,seems happy AA is 0 Fall risk  is low    08/19/2022    9:05 AM 08/19/2022    8:59 AM 10/15/2021    8:42 AM  Depression screen PHQ 2/9  Decreased Interest 0 0 0  Down, Depressed, Hopeless 0 0 0  PHQ - 2 Score 0 0 0  Altered sleeping   1  Tired, decreased energy '1 1 1  '$ Change in appetite 0 0 0  Feeling bad or failure about yourself  0 0 0  Trouble concentrating 0 0 0  Moving slowly or fidgety/restless 0 0 0  Suicidal thoughts  0 0  PHQ-9 Score   2  Difficult doing work/chores  Not difficult at all        08/19/2022    9:01 AM 10/15/2021    8:43 AM 10/14/2020    2:27 PM  GAD 7 : Generalized Anxiety Score  Nervous, Anxious, on Edge 0 0 2  Control/stop worrying 0 0 2  Worry too much - different things 0 0 1  Trouble relaxing 0 0 0  Restless 0 0 0  Easily annoyed or irritable 0 0 0  Afraid - awful might happen 0 0 0  Total GAD 7 Score 0 0 5      Upstream - 08/19/22 0859       Pregnancy Intention Screening   Does the patient want to become pregnant in the next year? N/A    Does the patient's partner want to become pregnant in the next year? N/A    Would the patient like to discuss contraceptive options today? N/A      Contraception  Wrap Up   Current Method Vasectomy    End Method Vasectomy    Contraception Counseling Provided No             Examination chaperoned by Levy Pupa LPN  Impression and Plan: 1. Encounter for gynecological examination with Papanicolaou smear of cervix Pap sent Pap in 3 years if normal Physical in 1 year Had negative mammogram 01/08/22  2. RLQ abdominal pain Has pain on and off, none today  3. Hypertension, unspecified type Will continue Microzide Meds ordered this encounter  Medications   hydrochlorothiazide (MICROZIDE) 12.5 MG capsule    Sig: Take 1 capsule (12.5 mg total) by mouth daily.    Dispense:  30 capsule    Refill:  12    Order Specific Question:   Supervising Provider    Answer:   Florian Buff [[id number]]   Keep check at home   4. Encounter for  screening fecal occult blood testing Hemoccult was negative

## 2022-08-23 LAB — CYTOLOGY - PAP
Comment: NEGATIVE
Diagnosis: NEGATIVE
High risk HPV: NEGATIVE

## 2022-09-06 ENCOUNTER — Other Ambulatory Visit (HOSPITAL_COMMUNITY)
Admission: RE | Admit: 2022-09-06 | Discharge: 2022-09-06 | Disposition: A | Discharge status: Home / Self Care | Payer: BC Managed Care – PPO | Source: Ambulatory Visit | Attending: Internal Medicine | Admitting: Internal Medicine

## 2022-09-06 DIAGNOSIS — Z1211 Encounter for screening for malignant neoplasm of colon: Secondary | ICD-10-CM

## 2022-09-07 ENCOUNTER — Encounter (HOSPITAL_COMMUNITY)
Admission: RE | Admit: 2022-09-07 | Discharge: 2022-09-07 | Disposition: A | Discharge status: Home / Self Care | Payer: BC Managed Care – PPO | Source: Ambulatory Visit | Attending: Internal Medicine | Admitting: Internal Medicine

## 2022-09-07 ENCOUNTER — Other Ambulatory Visit: Payer: Self-pay

## 2022-09-07 ENCOUNTER — Encounter (HOSPITAL_COMMUNITY): Payer: Self-pay

## 2022-09-07 VITALS — Ht 64.25 in | Wt 216.0 lb

## 2022-09-07 DIAGNOSIS — Z01818 Encounter for other preprocedural examination: Secondary | ICD-10-CM

## 2022-09-07 HISTORY — DX: Other specified postprocedural states: Z98.890

## 2022-09-07 NOTE — Pre-Procedure Instructions (Signed)
Attempted pre-op phonecall. Left VM for her to call us back. 

## 2022-09-09 ENCOUNTER — Ambulatory Visit (HOSPITAL_COMMUNITY)
Admission: RE | Admit: 2022-09-09 | Discharge: 2022-09-09 | Disposition: A | Discharge status: Home / Self Care | Payer: BC Managed Care – PPO | Attending: Internal Medicine | Admitting: Internal Medicine

## 2022-09-09 ENCOUNTER — Encounter (HOSPITAL_COMMUNITY)
Admission: RE | Disposition: A | Discharge status: Home / Self Care | Payer: Self-pay | Source: Home / Self Care | Attending: Internal Medicine

## 2022-09-09 ENCOUNTER — Ambulatory Visit (HOSPITAL_COMMUNITY): Payer: BC Managed Care – PPO | Admitting: Anesthesiology

## 2022-09-09 ENCOUNTER — Encounter (HOSPITAL_COMMUNITY): Payer: Self-pay

## 2022-09-09 ENCOUNTER — Other Ambulatory Visit: Payer: Self-pay

## 2022-09-09 DIAGNOSIS — Z9049 Acquired absence of other specified parts of digestive tract: Secondary | ICD-10-CM | POA: Insufficient documentation

## 2022-09-09 DIAGNOSIS — Z01818 Encounter for other preprocedural examination: Secondary | ICD-10-CM

## 2022-09-09 DIAGNOSIS — Z1211 Encounter for screening for malignant neoplasm of colon: Secondary | ICD-10-CM

## 2022-09-09 DIAGNOSIS — K5730 Diverticulosis of large intestine without perforation or abscess without bleeding: Secondary | ICD-10-CM | POA: Insufficient documentation

## 2022-09-09 DIAGNOSIS — G8929 Other chronic pain: Secondary | ICD-10-CM | POA: Insufficient documentation

## 2022-09-09 DIAGNOSIS — D125 Benign neoplasm of sigmoid colon: Secondary | ICD-10-CM | POA: Diagnosis not present

## 2022-09-09 DIAGNOSIS — K6389 Other specified diseases of intestine: Secondary | ICD-10-CM

## 2022-09-09 DIAGNOSIS — I10 Essential (primary) hypertension: Secondary | ICD-10-CM | POA: Diagnosis not present

## 2022-09-09 DIAGNOSIS — D123 Benign neoplasm of transverse colon: Secondary | ICD-10-CM

## 2022-09-09 HISTORY — PX: POLYPECTOMY: SHX5525

## 2022-09-09 HISTORY — PX: BIOPSY: SHX5522

## 2022-09-09 HISTORY — PX: COLONOSCOPY WITH PROPOFOL: SHX5780

## 2022-09-09 LAB — POCT PREGNANCY, URINE: Preg Test, Ur: NEGATIVE

## 2022-09-09 SURGERY — COLONOSCOPY WITH PROPOFOL
Anesthesia: General

## 2022-09-09 MED ORDER — PROPOFOL 10 MG/ML IV BOLUS
INTRAVENOUS | Status: DC | PRN
  Administered 2022-09-09: 200 mg via INTRAVENOUS

## 2022-09-09 MED ORDER — LACTATED RINGERS IV SOLN: INTRAVENOUS | Status: DC

## 2022-09-09 MED ORDER — PROPOFOL 500 MG/50ML IV EMUL
INTRAVENOUS | Status: DC | PRN
  Administered 2022-09-09: 150 ug/kg/min via INTRAVENOUS

## 2022-09-09 NOTE — Transfer of Care (Signed)
Immediate Anesthesia Transfer of Care Note  Patient: Regina Fisher  Procedure(s) Performed: COLONOSCOPY WITH PROPOFOL BIOPSY POLYPECTOMY  Patient Location: Short Stay  Anesthesia Type:General  Level of Consciousness: awake and patient cooperative  Airway & Oxygen Therapy: Patient Spontanous Breathing  Post-op Assessment: Report given to RN and Post -op Vital signs reviewed and stable  Post vital signs: Reviewed and stable  Last Vitals:  Vitals Value Taken Time  BP 98/47 0757  Temp 98.3 0757  Pulse 79 0757  Resp 23 0757  SpO2 98 0757    Last Pain:  Vitals:   09/09/22 0734  TempSrc:   PainSc: 3          Complications: No notable events documented.

## 2022-09-09 NOTE — Interval H&P Note (Signed)
History and Physical Interval Note:  09/09/2022 7:29 AM  Regina Fisher  has presented today for surgery, with the diagnosis of screening.  The various methods of treatment have been discussed with the patient and family. After consideration of risks, benefits and other options for treatment, the patient has consented to  Procedure(s) with comments: COLONOSCOPY WITH PROPOFOL (N/A) - 7:30am, asa 2 as a surgical intervention.  The patient's history has been reviewed, patient examined, no change in status, stable for surgery.  I have reviewed the patient's chart and labs.  Questions were answered to the patient's satisfaction.     Eloise Harman

## 2022-09-09 NOTE — Anesthesia Procedure Notes (Signed)
Date/Time: 09/09/2022 7:32 AM  Performed by: Vista Deck, CRNAPre-anesthesia Checklist: Patient identified, Emergency Drugs available, Suction available, Timeout performed and Patient being monitored Patient Re-evaluated:Patient Re-evaluated prior to induction Oxygen Delivery Method: Nasal Cannula

## 2022-09-09 NOTE — Anesthesia Postprocedure Evaluation (Signed)
Anesthesia Post Note  Patient: Regina Fisher  Procedure(s) Performed: COLONOSCOPY WITH PROPOFOL BIOPSY POLYPECTOMY  Patient location during evaluation: Phase II Anesthesia Type: General Level of consciousness: awake and alert and oriented Pain management: pain level controlled Vital Signs Assessment: post-procedure vital signs reviewed and stable Respiratory status: spontaneous breathing, nonlabored ventilation and respiratory function stable Cardiovascular status: blood pressure returned to baseline and stable Postop Assessment: no apparent nausea or vomiting Anesthetic complications: no  No notable events documented.   Last Vitals:  Vitals:   09/09/22 0659 09/09/22 0757  BP: 130/81 (!) 98/47  Pulse: 84 80  Resp: 16 (!) 22  Temp: 37.3 C 36.8 C  SpO2: 97% 98%    Last Pain:  Vitals:   09/09/22 0757  TempSrc: Oral  PainSc: 0-No pain                 Chayna Surratt C Korrie Hofbauer

## 2022-09-09 NOTE — Anesthesia Preprocedure Evaluation (Signed)
Anesthesia Evaluation  Patient identified by MRN, date of birth, ID band Patient awake    Reviewed: Allergy & Precautions, H&P , NPO status , Patient's Chart, lab work & pertinent test results  History of Anesthesia Complications (+) PONV and history of anesthetic complications  Airway Mallampati: II  TM Distance: >3 FB Neck ROM: Full    Dental  (+) Dental Advisory Given, Chipped Broken molarsx2:   Pulmonary neg pulmonary ROS   Pulmonary exam normal breath sounds clear to auscultation       Cardiovascular Exercise Tolerance: Good hypertension, Pt. on medications Normal cardiovascular exam Rhythm:Regular Rate:Normal     Neuro/Psych negative neurological ROS  negative psych ROS   GI/Hepatic negative GI ROS, Neg liver ROS,,,  Endo/Other  negative endocrine ROS    Renal/GU negative Renal ROS  negative genitourinary   Musculoskeletal negative musculoskeletal ROS (+)    Abdominal   Peds negative pediatric ROS (+)  Hematology negative hematology ROS (+)   Anesthesia Other Findings   Reproductive/Obstetrics negative OB ROS                             Anesthesia Physical Anesthesia Plan  ASA: 2  Anesthesia Plan: General   Post-op Pain Management: Minimal or no pain anticipated   Induction: Intravenous  PONV Risk Score and Plan: 1 and Propofol infusion  Airway Management Planned: Natural Airway and Nasal Cannula  Additional Equipment:   Intra-op Plan:   Post-operative Plan:   Informed Consent: I have reviewed the patients History and Physical, chart, labs and discussed the procedure including the risks, benefits and alternatives for the proposed anesthesia with the patient or authorized representative who has indicated his/her understanding and acceptance.     Dental advisory given  Plan Discussed with: CRNA and Surgeon  Anesthesia Plan Comments:        Anesthesia  Quick Evaluation

## 2022-09-09 NOTE — Discharge Instructions (Addendum)
  Colonoscopy Discharge Instructions  Read the instructions outlined below and refer to this sheet in the next few weeks. These discharge instructions provide you with general information on caring for yourself after you leave the hospital. Your doctor may also give you specific instructions. While your treatment has been planned according to the most current medical practices available, unavoidable complications occasionally occur.   ACTIVITY You may resume your regular activity, but move at a slower pace for the next 24 hours.  Take frequent rest periods for the next 24 hours.  Walking will help get rid of the air and reduce the bloated feeling in your belly (abdomen).  No driving for 24 hours (because of the medicine (anesthesia) used during the test).   Do not sign any important legal documents or operate any machinery for 24 hours (because of the anesthesia used during the test).  NUTRITION Drink plenty of fluids.  You may resume your normal diet as instructed by your doctor.  Begin with a light meal and progress to your normal diet. Heavy or fried foods are harder to digest and may make you feel sick to your stomach (nauseated).  Avoid alcoholic beverages for 24 hours or as instructed.  MEDICATIONS You may resume your normal medications unless your doctor tells you otherwise.  WHAT YOU CAN EXPECT TODAY Some feelings of bloating in the abdomen.  Passage of more gas than usual.  Spotting of blood in your stool or on the toilet paper.  IF YOU HAD POLYPS REMOVED DURING THE COLONOSCOPY: No aspirin products for 7 days or as instructed.  No alcohol for 7 days or as instructed.  Eat a soft diet for the next 24 hours.  FINDING OUT THE RESULTS OF YOUR TEST Not all test results are available during your visit. If your test results are not back during the visit, make an appointment with your caregiver to find out the results. Do not assume everything is normal if you have not heard from your  caregiver or the medical facility. It is important for you to follow up on all of your test results.  SEEK IMMEDIATE MEDICAL ATTENTION IF: You have more than a spotting of blood in your stool.  Your belly is swollen (abdominal distention).  You are nauseated or vomiting.  You have a temperature over 101.  You have abdominal pain or discomfort that is severe or gets worse throughout the day.   Your colonoscopy revealed 2 polyp(s) which I removed successfully. Await pathology results, my office will contact you. I recommend repeating colonoscopy in 7 years for surveillance purposes.   You also have diverticulosis. I would recommend increasing fiber in your diet or adding OTC Benefiber/Metamucil. Be sure to drink at least 4 to 6 glasses of water daily.  Small area of nodular mucosa on the right side of the colon.  Took biopsies to rule out a condition called microscopic colitis which can cause chronically loose stools especially in women.  Follow-up with GI in 3 months. Office will notify you.  I hope you have a great rest of your week!  Elon Alas. Abbey Chatters, D.O. Gastroenterology and Hepatology [facility]

## 2022-09-09 NOTE — Op Note (Signed)
[facility] Patient Name: Regina Fisher Procedure Date: 09/09/2022 7:02 AM MRN: [id number] Date of Birth: 1975/11/21 Attending MD: Elon Alas. Abbey Chatters , [place], [id number] CSN: [id number] Age: 47 Admit Type: Outpatient Procedure:                Colonoscopy Indications:              Screening for colorectal malignant neoplasm Providers:                Elon Alas. Abbey Chatters, DO, Janeece Riggers, RN, Aram Candela Referring MD:              Medicines:                See the Anesthesia note for documentation of the                            administered medications Complications:            No immediate complications. Estimated Blood Loss:     Estimated blood loss was minimal. Procedure:                Pre-Anesthesia Assessment:                           - The anesthesia plan was to use monitored                            anesthesia care (MAC).                           After obtaining informed consent, the colonoscope                            was passed under direct vision. Throughout the                            procedure, the patient's blood pressure, pulse, and                            oxygen saturations were monitored continuously. The                            [id number] scope was introduced through                            the anus and advanced to the the cecum, identified                            by appendiceal orifice and ileocecal valve. The                            colonoscopy was performed without difficulty. The                            patient tolerated the procedure well. The quality  of the bowel preparation was evaluated using the                            BBPS [facility] Bowel Preparation Scale) with scores                            of: Right Colon = 3, Transverse Colon = 3 and Left                            Colon = 3 (entire mucosa seen well with no residual                            staining, small fragments of stool or  opaque                            liquid). The total BBPS score equals 9. Scope In: 7:37:16 AM Scope Out: 7:49:57 AM Scope Withdrawal Time: 0 hours 9 minutes 14 seconds  Total Procedure Duration: 0 hours 12 minutes 41 seconds  Findings:      The perianal and digital rectal examinations were normal.      Multiple medium-mouthed diverticula were found in the sigmoid colon.      A 4 mm polyp was found in the transverse colon. The polyp was sessile.       The polyp was removed with a cold snare. Resection and retrieval were       complete.      A 5 mm polyp was found in the sigmoid colon. The polyp was sessile. The       polyp was removed with a cold snare. Resection and retrieval were       complete.      A localized area of mildly nodular mucosa was found at the hepatic       flexure. Biopsies were taken with a cold forceps for histology.      The terminal ileum appeared normal.      The exam was otherwise without abnormality on direct and retroflexion       views. Impression:               - Diverticulosis in the sigmoid colon.                           - One 4 mm polyp in the transverse colon, removed                            with a cold snare. Resected and retrieved.                           - One 5 mm polyp in the sigmoid colon, removed with                            a cold snare. Resected and retrieved.                           - Nodular mucosa at the hepatic flexure. Biopsied.                           -  The examined portion of the ileum was normal.                           - The examination was otherwise normal on direct                            and retroflexion views. Moderate Sedation:      Per Anesthesia Care Recommendation:           - Patient has a contact number available for                            emergencies. The signs and symptoms of potential                            delayed complications were discussed with the                            patient. Return  to normal activities tomorrow.                            Written discharge instructions were provided to the                            patient.                           - Resume previous diet.                           - Continue present medications.                           - Await pathology results.                           - Repeat colonoscopy in 7 years for surveillance.                           - Return to GI clinic in 3 months. Procedure Code(s):        --- Professional ---                           [phone], Colonoscopy, flexible; with removal of                            tumor(s), polyp(s), or other lesion(s) by snare                            technique                           45380, 59, Colonoscopy, flexible; with biopsy,                            single or multiple Diagnosis Code(s):        ---  Professional ---                           Z12.11, Encounter for screening for malignant                            neoplasm of colon                           D12.3, Benign neoplasm of transverse colon (hepatic                            flexure or splenic flexure)                           D12.5, Benign neoplasm of sigmoid colon                           K63.89, Other specified diseases of intestine                           K57.30, Diverticulosis of large intestine without                            perforation or abscess without bleeding CPT copyright 2022 [employer]. All rights reserved. The codes documented in this report are preliminary and upon coder review may  be revised to meet current compliance requirements. Elon Alas. Abbey Chatters, DO [place] Abbey Chatters, DO 09/09/2022 7:53:20 AM This report has been signed electronically. Number of Addenda: 0

## 2022-09-10 LAB — SURGICAL PATHOLOGY

## 2022-09-16 ENCOUNTER — Encounter (HOSPITAL_COMMUNITY): Payer: Self-pay | Admitting: Internal Medicine

## 2022-09-30 ENCOUNTER — Encounter: Payer: Self-pay | Admitting: Internal Medicine

## 2022-12-17 IMAGING — MG DIGITAL DIAGNOSTIC BILAT W/ TOMO W/ CAD
8 series · 8 of 24 positions shown · non-contrast
Comparison: Previous exams.

CLINICAL DATA: Follow-up for probably benign left breast mass.

EXAM:
DIGITAL DIAGNOSTIC BILATERAL MAMMOGRAM WITH TOMOSYNTHESIS AND CAD;
ULTRASOUND LEFT BREAST LIMITED
TECHNIQUE: Bilateral digital diagnostic mammography and breast tomosynthesis
was performed. The images were evaluated with computer-aided
detection.; Targeted ultrasound examination of the left breast was
performed

[R CC synth-2D]
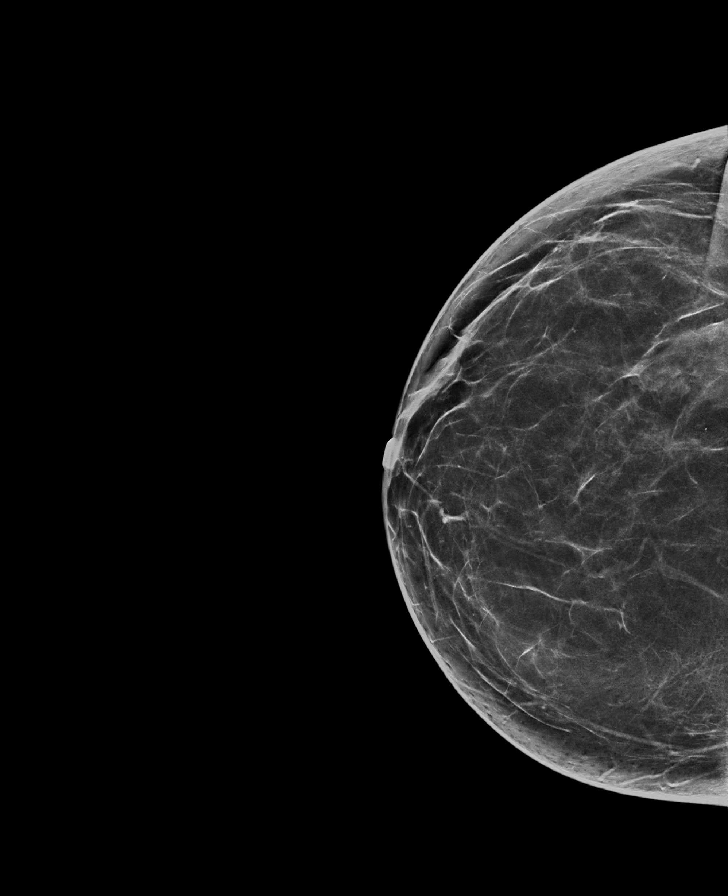

[R MLO synth-2D]
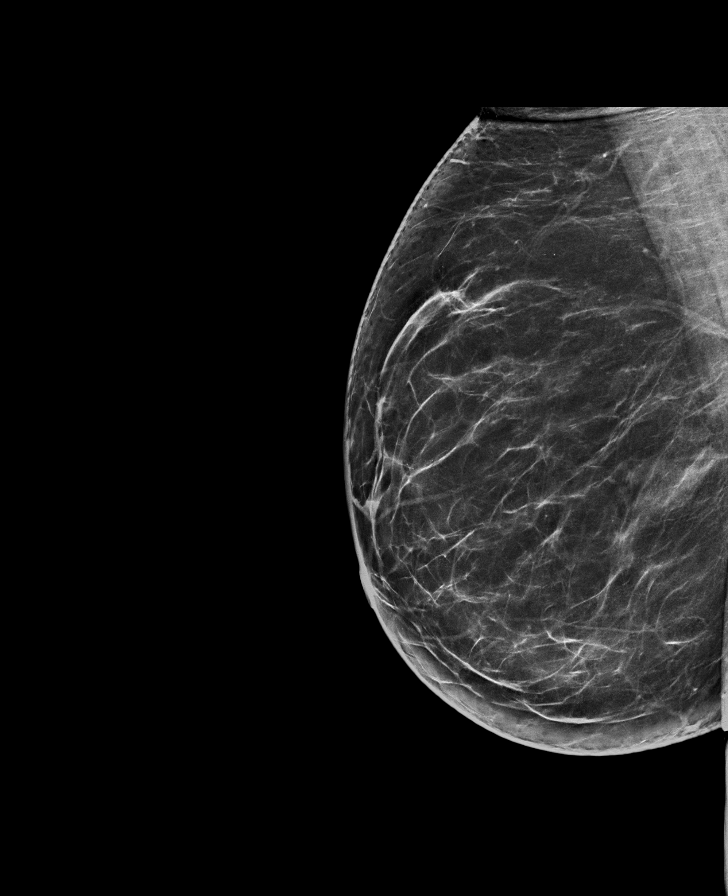

[L CC synth-2D]
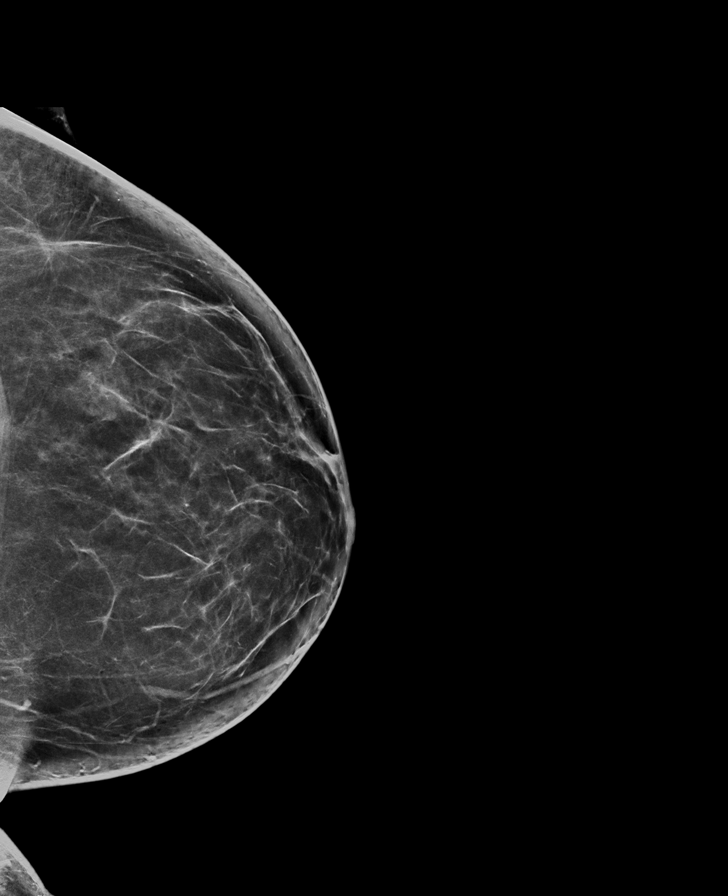

[L MLO synth-2D]
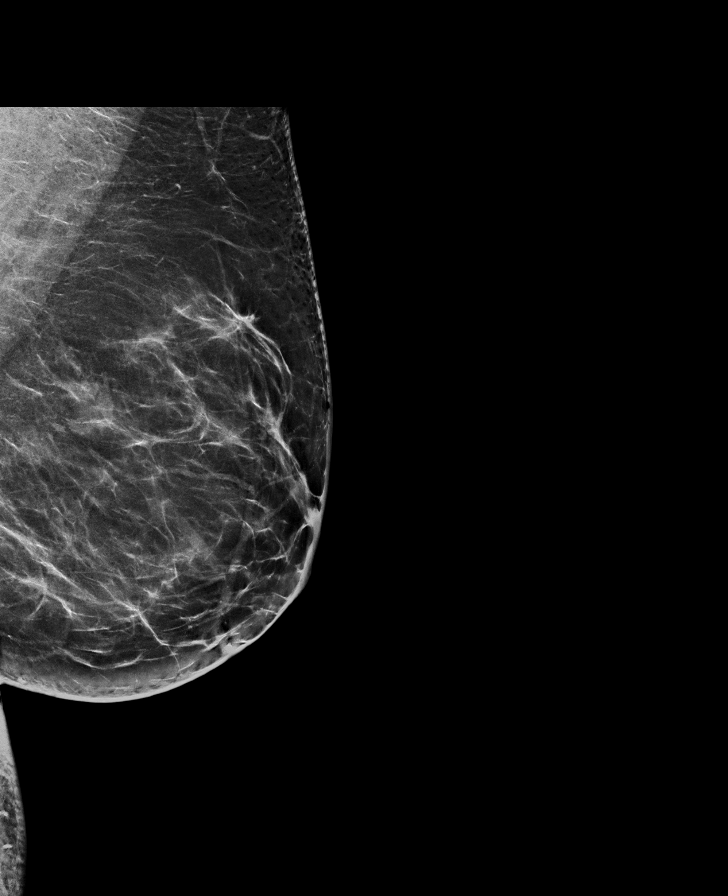

[L MLO tomo · tomo slice 43/85.0]
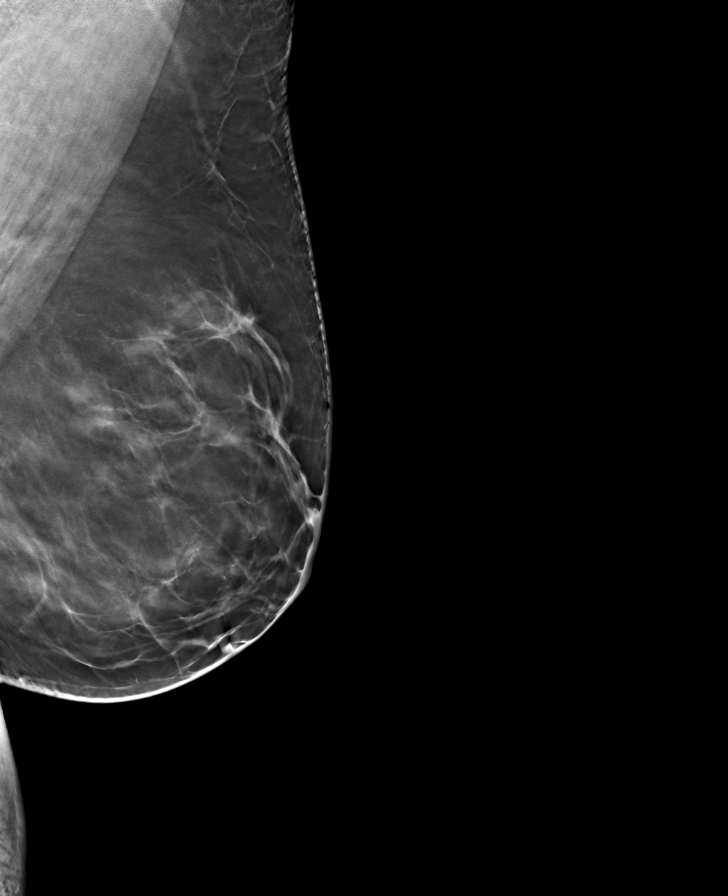

[R CC tomo · tomo slice 41/81.0]
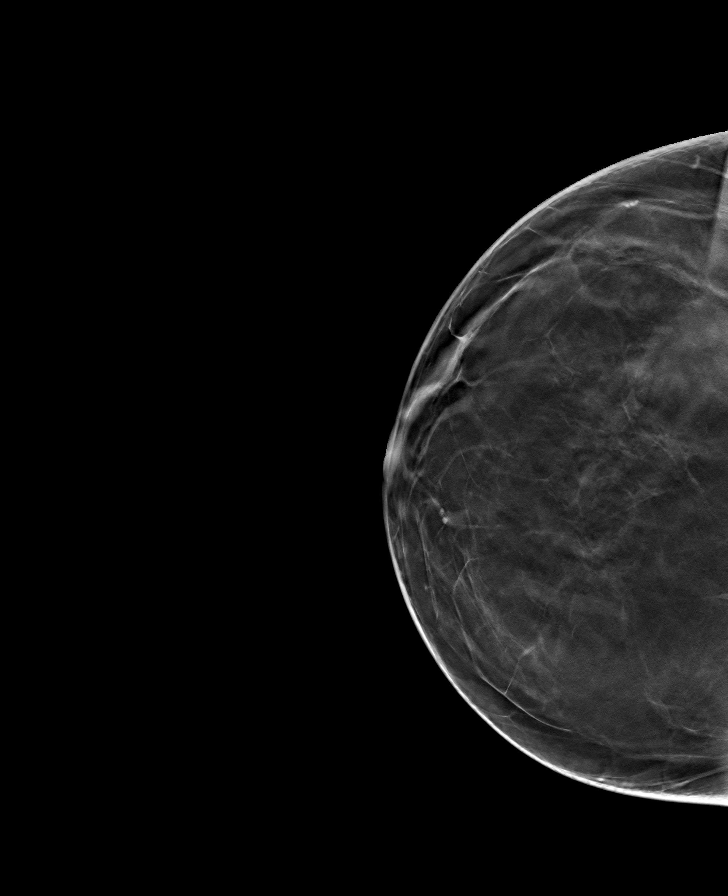

[L CC tomo · tomo slice 43/84.0]
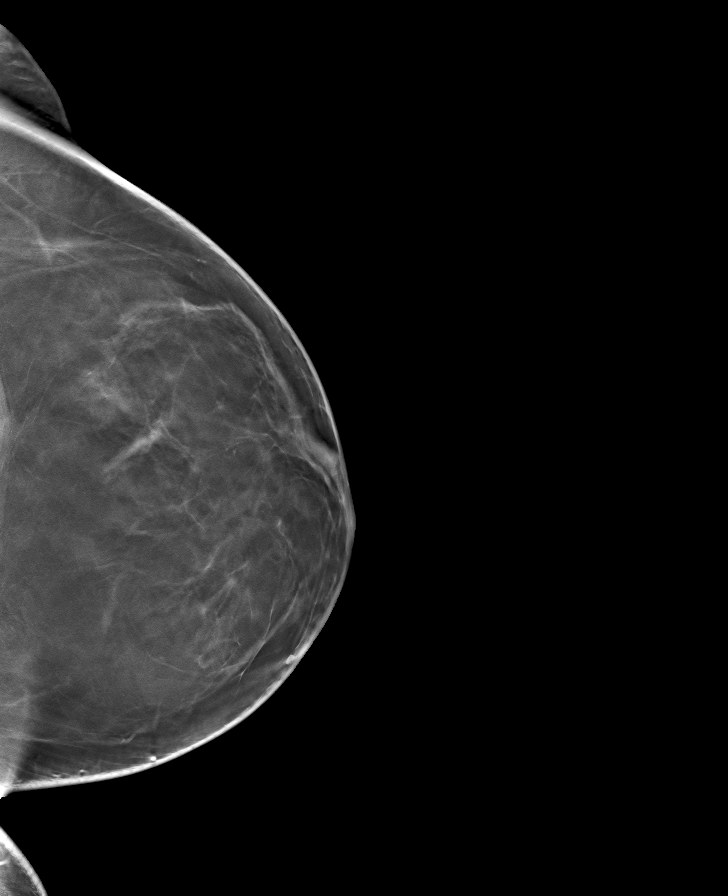

[R MLO tomo · tomo slice 41/82.0]
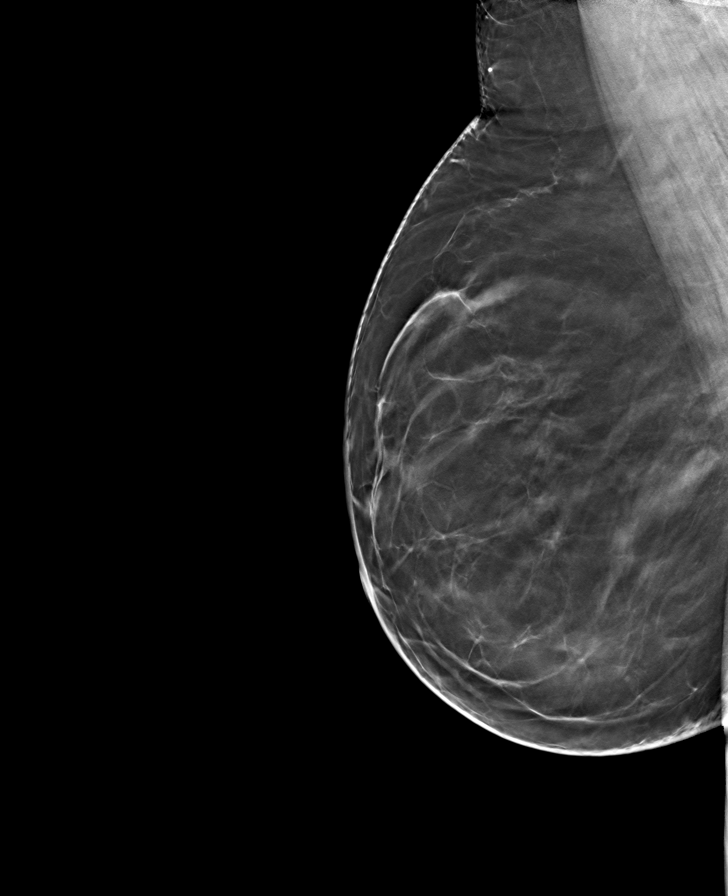

[8 of 24 positions shown; findings below may reference images not displayed]

ACR Breast Density Category b: There are scattered areas of
fibroglandular density.
FINDINGS: No suspicious masses or calcifications are seen in either breast.
The oval mass in the upper-outer left breast mid to posterior depth
appears stable when compared to prior mammograms dating back to
09/21/2017.

Targeted ultrasound of the outer left breast was performed. The oval
hypoechoic mass demonstrating imaging features most suggestive of a
benign fat lobule at 3 o'clock 7 cm from nipple measures smaller
today, measuring 1.1 x 0.4 x 0.7 cm, previously 1.3 x 0.5 x 1 cm. No
suspicious masses or abnormality seen in the outer left breast.
IMPRESSION: No findings of malignancy in either breast.

RECOMMENDATION:
Screening mammogram in one year.(Code:AM-V-KGQ)

I have discussed the findings and recommendations with the patient.
If applicable, a reminder letter will be sent to the patient
regarding the next appointment.

BI-RADS CATEGORY  2: Benign.

## 2023-03-07 ENCOUNTER — Other Ambulatory Visit (HOSPITAL_COMMUNITY): Payer: Self-pay | Admitting: Family Medicine

## 2023-03-07 DIAGNOSIS — Z1231 Encounter for screening mammogram for malignant neoplasm of breast: Secondary | ICD-10-CM

## 2023-03-10 ENCOUNTER — Ambulatory Visit (HOSPITAL_COMMUNITY)
Admission: RE | Admit: 2023-03-10 | Discharge: 2023-03-10 | Disposition: A | Discharge status: Home / Self Care | Payer: BC Managed Care – PPO | Source: Ambulatory Visit | Attending: Family Medicine | Admitting: Family Medicine

## 2023-03-10 DIAGNOSIS — Z1231 Encounter for screening mammogram for malignant neoplasm of breast: Secondary | ICD-10-CM | POA: Diagnosis present

## 2023-04-25 ENCOUNTER — Ambulatory Visit: Payer: BC Managed Care – PPO | Admitting: Internal Medicine

## 2023-08-29 IMAGING — DX DG CHEST 2V
2 series · 2 of 2 positions shown · non-contrast
Comparison: None.

CLINICAL DATA: Persistent cough.

EXAM:
CHEST - 2 VIEW

[chest pa]
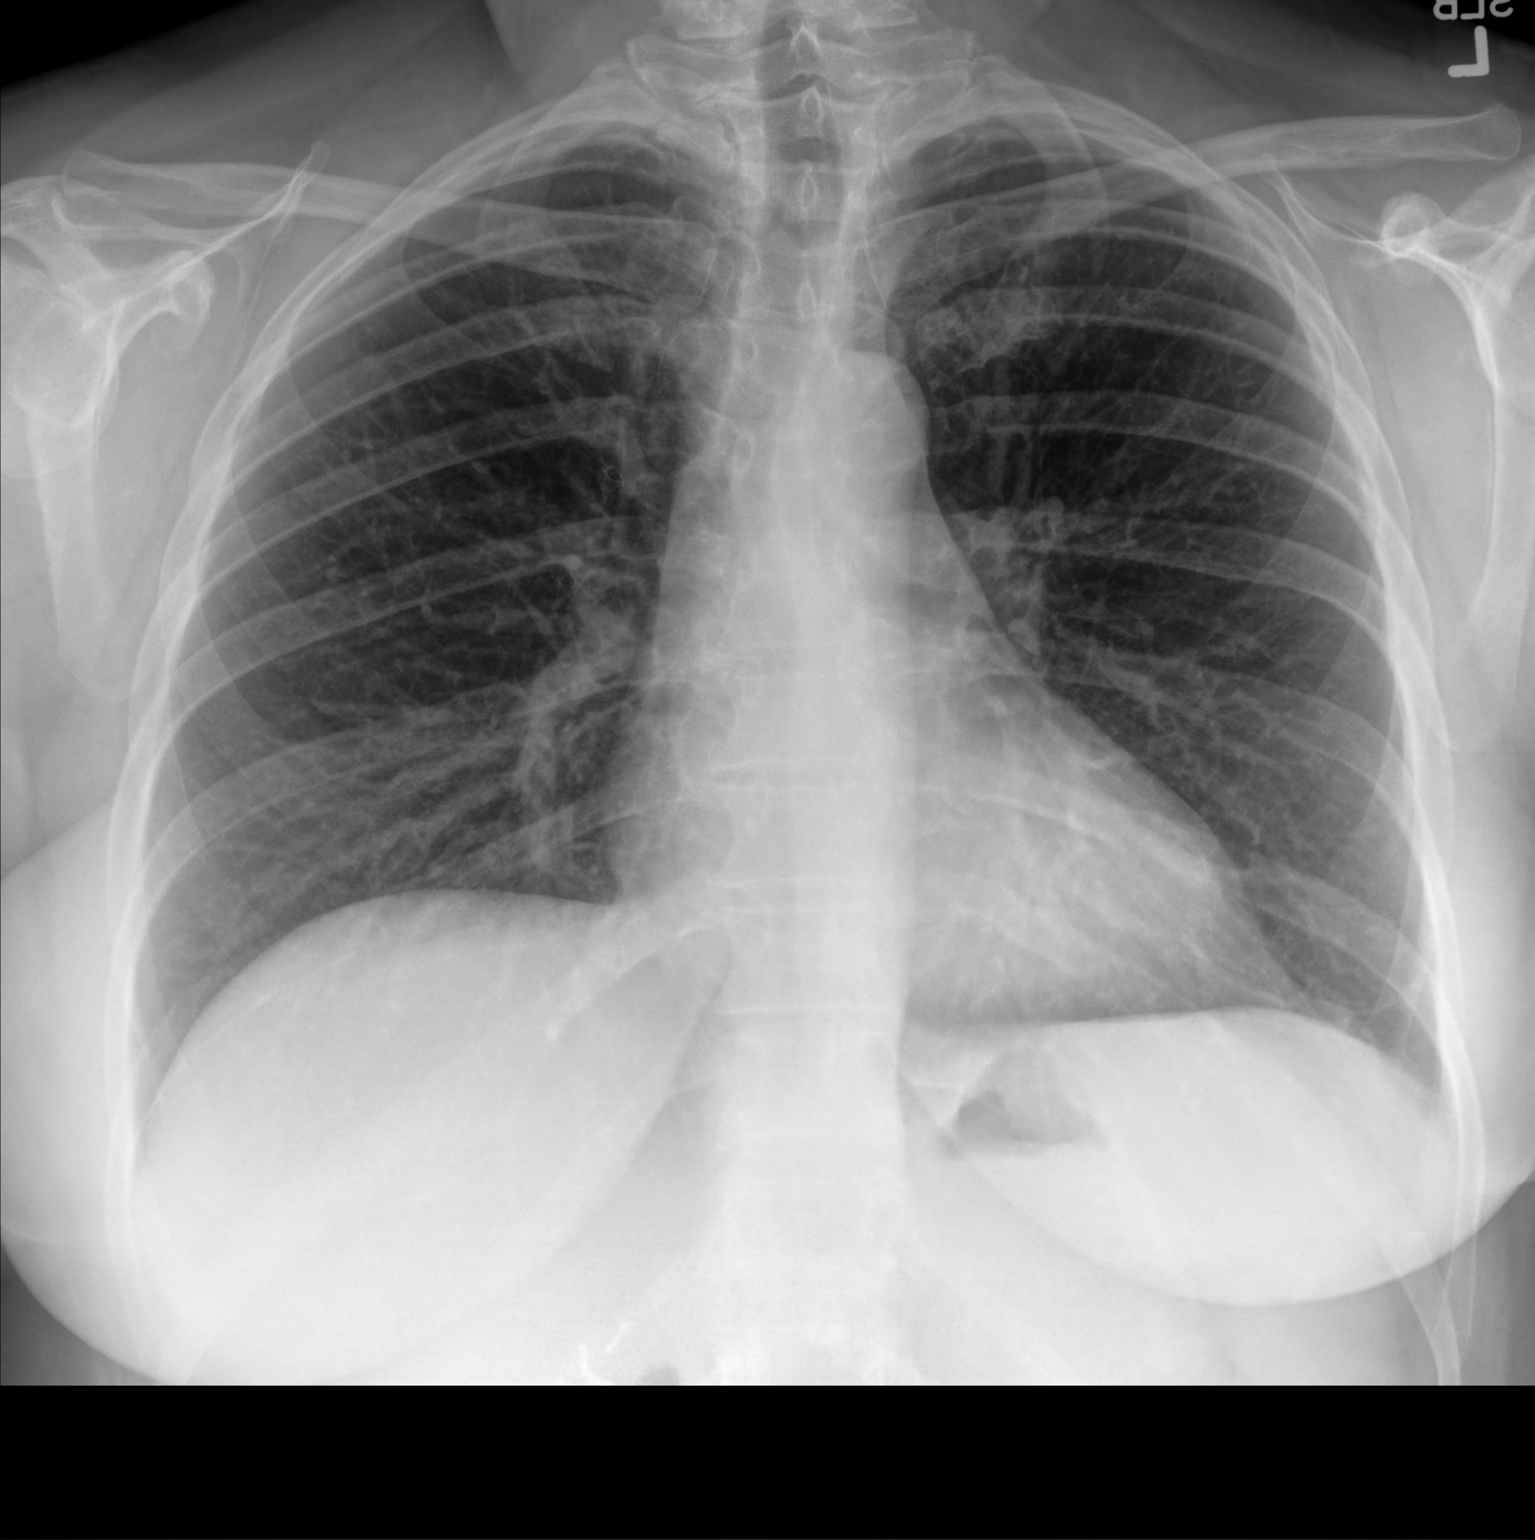

[chest lat]
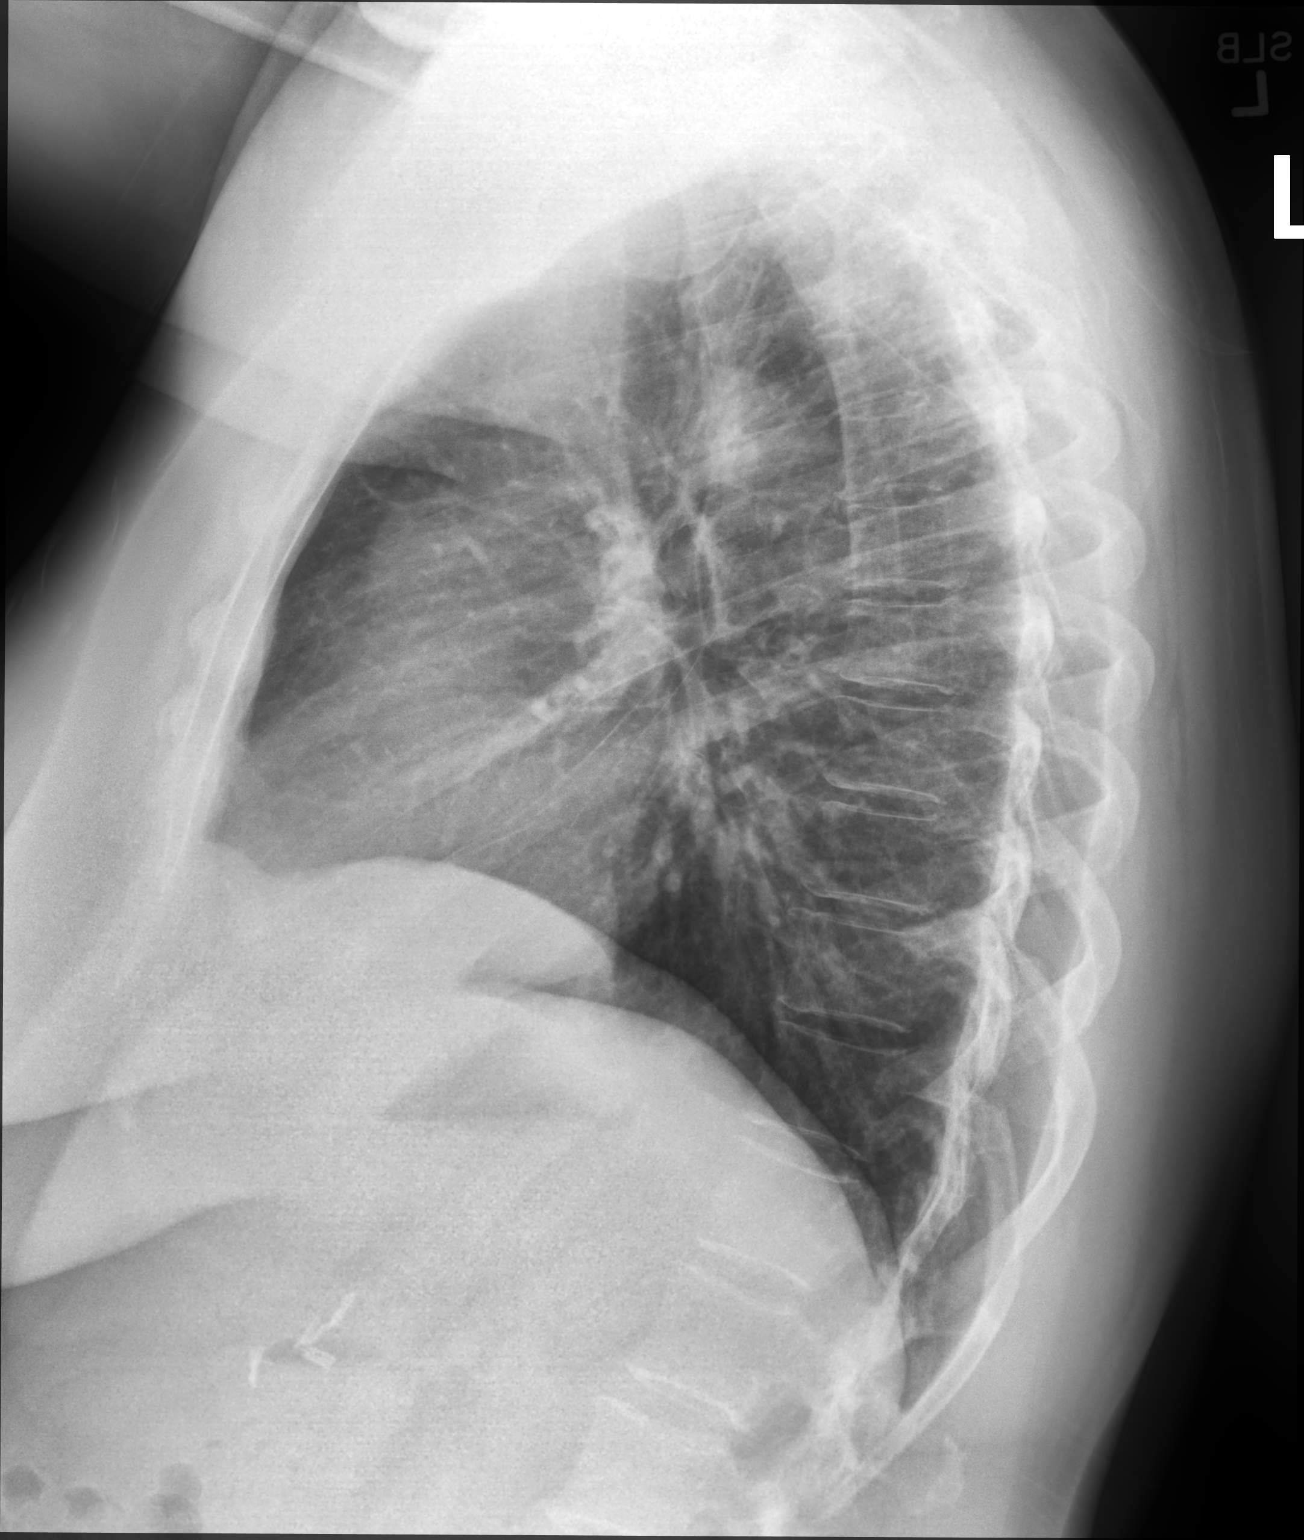

[2 of 2 positions shown; findings below may reference images not displayed]

FINDINGS: The heart size and mediastinal contours are within normal limits.
Both lungs are clear. The visualized skeletal structures are
unremarkable.
IMPRESSION: No active cardiopulmonary disease.

## 2024-02-11 LAB — HEMOGLOBIN A1C: Hemoglobin A1C: 5.4

## 2024-02-11 LAB — AMB RESULTS CONSOLE CBG: Glucose: 118
